# Patient Record
Sex: Female | Born: 1940 | Race: White | Hispanic: No | Smoking: Former smoker
Health system: Southern US, Community
[De-identification: ages and names within clinical notes are randomized; demographics above are authoritative.]

## PROBLEM LIST (undated history)

## (undated) DIAGNOSIS — J449 Chronic obstructive pulmonary disease, unspecified: Secondary | ICD-10-CM

## (undated) DIAGNOSIS — I10 Essential (primary) hypertension: Secondary | ICD-10-CM

## (undated) HISTORY — PX: OTHER SURGICAL HISTORY: SHX169

---

## 2016-09-12 HISTORY — PX: HIP SURGERY: SHX245

## 2017-10-20 ENCOUNTER — Other Ambulatory Visit: Payer: Self-pay | Admitting: Sports Medicine

## 2017-10-20 DIAGNOSIS — M545 Low back pain: Secondary | ICD-10-CM

## 2017-10-25 ENCOUNTER — Ambulatory Visit
Admission: RE | Admit: 2017-10-25 | Discharge: 2017-10-25 | Disposition: A | Discharge status: Home / Self Care | Payer: Medicare Other | Source: Ambulatory Visit | Attending: Sports Medicine | Admitting: Sports Medicine

## 2017-10-25 DIAGNOSIS — M545 Low back pain: Secondary | ICD-10-CM

## 2018-04-20 ENCOUNTER — Ambulatory Visit (INDEPENDENT_AMBULATORY_CARE_PROVIDER_SITE_OTHER): Payer: Medicare Other | Admitting: Internal Medicine

## 2018-04-20 ENCOUNTER — Encounter: Payer: Self-pay | Admitting: Internal Medicine

## 2018-04-20 ENCOUNTER — Ambulatory Visit (INDEPENDENT_AMBULATORY_CARE_PROVIDER_SITE_OTHER)
Admission: RE | Admit: 2018-04-20 | Discharge: 2018-04-20 | Disposition: A | Discharge status: Home / Self Care | Payer: Medicare Other | Source: Ambulatory Visit | Attending: Internal Medicine | Admitting: Internal Medicine

## 2018-04-20 VITALS — BP 120/64 | HR 84 | Ht 62.0 in | Wt 152.0 lb

## 2018-04-20 DIAGNOSIS — J449 Chronic obstructive pulmonary disease, unspecified: Secondary | ICD-10-CM | POA: Diagnosis not present

## 2018-04-20 DIAGNOSIS — J9611 Chronic respiratory failure with hypoxia: Secondary | ICD-10-CM

## 2018-04-20 MED ORDER — PREDNISONE 10 MG PO TABS
ORAL_TABLET | ORAL | 0 refills | Status: DC
Start: 2018-04-20 — End: 2018-06-04

## 2018-04-20 MED ORDER — TIOTROPIUM BROMIDE MONOHYDRATE 2.5 MCG/ACT IN AERS: 2.0000 | INHALATION_SPRAY | Freq: Every day | RESPIRATORY_TRACT | 11 refills | Status: DC

## 2018-04-20 MED ORDER — TIOTROPIUM BROMIDE MONOHYDRATE 2.5 MCG/ACT IN AERS: 2.0000 | INHALATION_SPRAY | Freq: Every day | RESPIRATORY_TRACT | 0 refills | Status: DC

## 2018-04-20 NOTE — Patient Instructions (Addendum)
Work on inhaler technique:  relax and gently blow all the way out then take a nice smooth deep breath back in, triggering the inhaler at same time you start breathing in.  Hold for up to 5 seconds if you can. Blow out thru nose. Rinse and gargle with water when done   Plan A = Automatic = symbicort 160 Take 2 puffs first thing in am and then another 2 puffs about 12 hours later and spiriva 2 puffs just in the am   Plan B = Backup Only use your albuterol as a rescue medication to be used if you can't catch your breath by resting or doing a relaxed purse lip breathing pattern.  - The less you use it, the better it will work when you need it. - Ok to use the inhaler up to 2 puffs  every 4 hours if you must but call for appointment if use goes up over your usual need - Don't leave home without it !!  (think of it like the spare tire for your car)   Plan C = Crisis - only use your albuterol nebulizer if you first try Plan B and it fails to help > ok to use the nebulizer up to every 4 hours but if start needing it regularly call for immediate appointment   Prednisone 10 mg take  4 each am x 2 days,   2 each am x 2 days,  1 each am x 2 days and stop    Pantoprazole (protonix) 40 mg   Take  30-60 min before first meal of the day     GERD (REFLUX)  is an extremely common cause of respiratory symptoms just like yours , many times with no obvious heartburn at all.    It can be treated with medication, but also with lifestyle changes including elevation of the head of your bed (ideally with 6 inch  bed blocks),  Smoking cessation, avoidance of late meals, excessive alcohol, and avoid fatty foods, chocolate, peppermint, colas, red wine, and acidic juices such as orange juice.  NO MINT OR MENTHOL PRODUCTS SO NO COUGH DROPS   USE SUGARLESS CANDY INSTEAD (Jolley ranchers or Stover's or Life Savers) or even ice chips will also do - the key is to swallow to prevent all throat clearing. NO OIL BASED VITAMINS  - use powdered substitutes.     Please remember to go to the  x-ray department downstairs in the basement  for your tests - we will call you with the results when they are available.   Please schedule a follow up office visit in 6 weeks, call sooner if needed with pfts on return  - needs alpha one ? Allergy profile next ov and bmet for hc03

## 2018-04-20 NOTE — Assessment & Plan Note (Addendum)
Quit smoking 2018  - 04/20/2018  After extensive coaching inhaler device  effectiveness =    50% smi > try spiriva respimat instead of dpi   DDX of  difficult airways management almost all start with A and  include Adherence, Ace Inhibitors, Acid Reflux, Active Sinus Disease, Alpha 1 Antitripsin deficiency, Anxiety masquerading as Airways dz,  ABPA,  Allergy(esp in young), Aspiration (esp in elderly), Adverse effects of meds,  Active smokers, A bunch of PE's (a small clot burden can't cause this syndrome unless there is already severe underlying pulm or vascular dz with poor reserve) plus two Bs  = Bronchiectasis and Beta blocker use..and one C= CHF   Adherence is always the initial "prime suspect" and is a multilayered concern that requires a "trust but verify" approach in every patient - starting with knowing how to use medications, especially inhalers, correctly, keeping up with refills and understanding the fundamental difference between maintenance and prns vs those medications only taken for a very short course and then stopped and not refilled.  - see hfa teaching  - return with all meds in hand using a trust but verify approach to confirm accurate Medication  Reconciliation The principal here is that until we are certain that the  patients are doing what we've asked, it makes no sense to ask them to do more.    ? Acid (or non-acid) GERD > always difficult to exclude as up to 75% of pts in some series report no assoc GI/ Heartburn symptoms> rec max (24h)  acid suppression and diet restrictions/ reviewed and instructions given in writing.    ? Allergy component > Prednisone 10 mg take  4 each am x 2 days,   2 each am x 2 days,  1 each am x 2 days and stop   ? Adverse effects of dpi spriva with upper airway concerns > try smi if can master technique  ? Alpha one AT > need to check status if not done     Total time devoted to counseling  > 50 % of initial 60 min office visit:  review case  with pt/ discussion of options/alternatives/ personally creating written customized instructions  in presence of pt  then going over those specific  Instructions directly with the pt including how to use all of the meds but in particular covering each new medication in detail and the difference between the maintenance= "automatic" meds and the prns using an action plan format for the latter (If this problem/symptom => do that organization reading Left to right).  Please see AVS from this visit for a full list of these instructions which I personally wrote for this pt and  are unique to this visit.   See device teaching which extended face to face time for this visit

## 2018-04-20 NOTE — Progress Notes (Signed)
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 Pulmonary Consult    Self referral for COPD. She lives in [place] and here also and wants to est care here. She has been noticing increased wheezing and SOB for the past several days. She is using her albuterol 6-7 x per day and neb about once per wk.    Dyspnea:  MMRC3 = can't walk 100 yards even at a slow pace at a flat grade s stopping due to sob  Even on 3lpm Cough: occ with food on protonix but not ac/dry  Sleep: bed is flat with 2 pillows here/ uses bricks under Headboard in [facility] SABA use: as above/ way over using at baseline and even more so last few days    No obvious day to day or daytime variability or assoc excess/ purulent sputum or mucus plugs or hemoptysis or cp or chest tightness,    overt sinus or hb symptoms.   Sleeps as above  without nocturnal  or early am exacerbation  of respiratory  c/o's or need for noct saba. Also denies any obvious fluctuation of symptoms with weather or environmental changes or other aggravating or alleviating factors except as outlined above   No unusual exposure hx or h/o childhood pna/ asthma or knowledge of premature birth.  Current Allergies, Complete Past Medical History, Past Surgical History, Family History, and Social History were reviewed in [employer] record.  ROS  The following are not active complaints unless bolded Hoarseness, sore throat, dysphagia, dental problems, itching, sneezing,  nasal congestion or discharge of excess mucus or purulent secretions,  ear ache,   fever, chills, sweats, unintended wt loss or wt gain, classically pleuritic or exertional cp,  orthopnea pnd or arm/hand swelling  or leg swelling, presyncope, palpitations, abdominal pain, anorexia, nausea, vomiting, diarrhea  or change in bowel habits or change in bladder habits, change in stools or change in urine, dysuria, hematuria,  rash, arthralgias, visual complaints, headache, numbness, weakness or ataxia or problems with walking or coordination,  change in mood or  memory.            No past medical history on file.  Outpatient Medications Prior to Visit  Medication Sig Dispense Refill  . albuterol (PROAIR HFA) 108 (90 Base) MCG/ACT inhaler Inhale 2 puffs into the lungs every 4 (four) hours as needed for wheezing or shortness of breath.    . budesonide-formoterol (SYMBICORT) 160-4.5 MCG/ACT inhaler Inhale 2 puffs into the lungs 2 (two) times daily.    . OXYGEN 3lpm 24/7  DME-?    . pantoprazole (PROTONIX) 40 MG tablet Take 40 mg by mouth daily.    Marland Kitchen tiotropium (SPIRIVA) 18 MCG inhalation capsule Place 18 mcg into inhaler and inhale daily.    Marland Kitchen triamterene-hydrochlorothiazide (DYAZIDE) 37.5-25 MG capsule Take 1 capsule by mouth daily.     No facility-administered medications prior to visit.  Objective:     BP 120/64 (BP Location: Left Arm, Cuff Size: Normal)   Pulse 84   Ht 5\' 2"  (1.575 m)   Wt 152 lb (68.9 kg)   SpO2 90%   BMI 27.80 kg/m   SpO2: 90 % O2 Type: Continuous O2 O2 Flow Rate (L/min): 3 L/min   W/c bound elderly tremulous wf nad     HEENT: nl oropharynx. Nl external ear canals without cough reflex - mild bilateral non-specific turbinate edema  - top denture   NECK :  without JVD/Nodes/TM/ nl carotid upstrokes bilaterally   LUNGS: no acc muscle use,  Mod barrel  contour chest wall with bilateral  Distant bs s audible wheeze and  without cough on insp or exp maneuver and mod   Hyperresonant  to  percussion bilaterally      CV:  RRR  no s3 or murmur or increase in P2, and no edema   ABD:  soft and nontender with pos mid insp Hoover's  in the supine position. No bruits or organomegaly appreciated, bowel sounds nl  MS:    ext warm without deformities, calf tenderness, cyanosis or clubbing No obvious joint restrictions   SKIN: warm and dry without lesions    NEURO:  alert, approp, nl sensorium with  no motor or cerebellar deficits apparent.      CXR PA and Lateral:   04/20/2018 :    I personally reviewed images and agree with radiology impression as follows:   Chronic lung changes and emphysema without evidence of acute cardiopulmonary disease.       Assessment   COPD pfts pending  Quit smoking 2018  - 04/20/2018  After extensive coaching inhaler device  effectiveness =    50% smi > try spiriva respimat instead of dpi   DDX of  difficult airways management almost all start with A and  include Adherence, Ace Inhibitors, Acid Reflux, Active Sinus Disease, Alpha 1 Antitripsin deficiency, Anxiety masquerading as Airways dz,  ABPA,  Allergy(esp in young), Aspiration (esp in elderly), Adverse effects of meds,  Active smokers, A bunch of PE's (a small clot burden can't cause this syndrome unless there is already severe underlying pulm or vascular dz with poor reserve) plus two Bs  = Bronchiectasis and Beta blocker use..and one C= CHF   Adherence is always the initial "prime suspect" and is a multilayered concern that requires a "trust but verify" approach in every patient - starting with knowing how to use medications, especially inhalers, correctly, keeping up with refills and understanding the fundamental difference between maintenance and prns vs those medications only taken for a very short course and then stopped and not refilled.  - see hfa teaching  - return with all meds in hand using a trust but verify approach to confirm accurate Medication  Reconciliation The principal here is that until we are certain  that the  patients are doing what we've asked, it makes no sense to ask them to do more.    ? Acid (or non-acid) GERD > always difficult to exclude as up to 75% of pts in some series report no assoc GI/ Heartburn symptoms> rec max (24h)  acid suppression and diet restrictions/ reviewed and instructions given in writing.    ? Allergy component > Prednisone 10 mg take  4 each am x 2 days,   2 each am x 2 days,  1 each am x 2 days and stop   ? Adverse effects of dpi spriva  with upper airway concerns > try smi if can master technique  ? Alpha one AT > need to check status if not done     Total time devoted to counseling  > 50 % of initial 60 min office visit:  review case with pt/ discussion of options/alternatives/ personally creating written customized instructions  in presence of pt  then going over those specific  Instructions directly with the pt including how to use all of the meds but in particular covering each new medication in detail and the difference between the maintenance= "automatic" meds and the prns using an action plan format for the latter (If this problem/symptom => do that organization reading Left to right).  Please see AVS from this visit for a full list of these instructions which I personally wrote for this pt and  are unique to this visit.   See device teaching which extended face to face time for this visit               Sandrea HughsMichael Kshawn Canal, MD 04/20/2018

## 2018-04-21 ENCOUNTER — Encounter: Payer: Self-pay | Admitting: Internal Medicine

## 2018-04-21 DIAGNOSIS — J9611 Chronic respiratory failure with hypoxia: Secondary | ICD-10-CM | POA: Insufficient documentation

## 2018-04-21 NOTE — Assessment & Plan Note (Signed)
[facility] 04/20/2018   Appears Adequate control on present rx, reviewed in detail with pt > no change in rx needed  For now > check bmet last ov for HC03 level

## 2018-04-23 NOTE — Progress Notes (Signed)
LMTCB

## 2018-04-24 NOTE — Progress Notes (Signed)
Spoke with pt and notified of results per Dr. Wert. Pt verbalized understanding and denied any questions. 

## 2018-06-04 ENCOUNTER — Other Ambulatory Visit (INDEPENDENT_AMBULATORY_CARE_PROVIDER_SITE_OTHER): Payer: Medicare Other

## 2018-06-04 ENCOUNTER — Ambulatory Visit (INDEPENDENT_AMBULATORY_CARE_PROVIDER_SITE_OTHER): Payer: Medicare Other | Admitting: Internal Medicine

## 2018-06-04 ENCOUNTER — Encounter: Payer: Self-pay | Admitting: Internal Medicine

## 2018-06-04 VITALS — BP 126/82 | HR 89 | Ht 59.75 in | Wt 147.0 lb

## 2018-06-04 DIAGNOSIS — J449 Chronic obstructive pulmonary disease, unspecified: Secondary | ICD-10-CM

## 2018-06-04 DIAGNOSIS — J9611 Chronic respiratory failure with hypoxia: Secondary | ICD-10-CM | POA: Diagnosis not present

## 2018-06-04 LAB — BASIC METABOLIC PANEL
BUN: 15 mg/dL (ref 6–23)
CALCIUM: 9.4 mg/dL (ref 8.4–10.5)
CO2: 30 mEq/L (ref 19–32)
Chloride: 99 mEq/L (ref 96–112)
Creatinine, Ser: 0.41 mg/dL (ref 0.40–1.20)
GFR: 160.04 mL/min (ref >60.00)
GLUCOSE: 93 mg/dL (ref 70–99)
POTASSIUM: 3.8 meq/L (ref 3.5–5.1)
SODIUM: 139 meq/L (ref 135–145)

## 2018-06-04 LAB — PULMONARY FUNCTION TEST
DL/VA % pred: 58 %
DL/VA: 2.45 ml/min/mmHg/L
DLCO UNC: 8.02 ml/min/mmHg
DLCO unc % pred: 43 %
FEF 25-75 POST: 0.22 L/s
FEF 25-75 Pre: 0.21 L/sec
FEF2575-%CHANGE-POST: 6 %
FEF2575-%PRED-POST: 16 %
FEF2575-%PRED-PRE: 15 %
FEV1-%CHANGE-POST: 0 %
FEV1-%PRED-POST: 35 %
FEV1-%PRED-PRE: 35 %
FEV1-POST: 0.59 L
FEV1-PRE: 0.6 L
FEV1FVC-%CHANGE-POST: 2 %
FEV1FVC-%PRED-PRE: 54 %
FEV6-%Change-Post: 1 %
FEV6-%PRED-PRE: 61 %
FEV6-%Pred-Post: 62 %
FEV6-POST: 1.32 L
FEV6-Pre: 1.3 L
FEV6FVC-%Change-Post: 5 %
FEV6FVC-%Pred-Post: 99 %
FEV6FVC-%Pred-Pre: 94 %
FVC-%Change-Post: -3 %
FVC-%PRED-PRE: 64 %
FVC-%Pred-Post: 62 %
FVC-PRE: 1.46 L
FVC-Post: 1.41 L
PRE FEV1/FVC RATIO: 41 %
PRE FEV6/FVC RATIO: 89 %
Post FEV1/FVC ratio: 42 %
Post FEV6/FVC ratio: 94 %

## 2018-06-04 NOTE — Progress Notes (Signed)
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 Pulmonary Consult    Self referral for COPD. She lives in [place] and here also and wants to est care here. She has been noticing increased wheezing and SOB for the past several days. She is using her albuterol 6-7 x per day and neb about once per wk.    Dyspnea:  MMRC3 = can't walk 100 yards even at a slow pace at a flat grade s stopping due to sob  Even on 3lpm Cough: occ with food on protonix but not ac/dry  Sleep: bed is flat with 2 pillows here/ uses bricks under Headboard in [place] SABA use: as above/ way over using at baseline and even more so last few days rec rec Work on inhaler technique:  relax and gently blow all the way out then take a nice smooth deep breath back in, triggering the inhaler at same time you start breathing in.  Hold for up to 5 seconds if you can. Blow out thru nose. Rinse and gargle with water when done Plan A = Automatic = symbicort 160 Take 2 puffs first thing in am and then another 2 puffs about 12 hours later and spiriva 2 puffs just in the am  Plan B = Backup Only use your albuterol as a rescue medication  Plan C = Crisis - only use your albuterol nebulizer if you first try Plan B Prednisone 10 mg take  4 each am x 2 days,   2 each am x 2 days,  1 each am x 2 days and stop  Pantoprazole (protonix) 40 mg   Take  30-60 min before first meal of the day   GERD diet  Please remember to go to the  x-ray department downstairs in the basement  for your tests - we will call you with the results when they are  available. Please schedule a follow up office visit in 6 weeks, call sooner if needed with pfts on return  - needs alpha one ? Allergy profile next ov and bmet for hc03     06/04/2018  f/u ov/Wert re: copd III / 02 dep  Chief Complaint  Patient presents with  . Follow-up    PFT's done today.  Breathing is unchanged since her last visit. She is using her albuterol inhaler 6-8 x per day.   Dyspnea:  Room to room , no better p pred and not able to use hfa well at all yet Cough: min mucoid p uses neb  Sleeping: 2 pillows / flat  SABA use: too much as above "because I thought I had to" 02: 3lpm 24/7   No obvious day to day or daytime variability or assoc excess/ purulent sputum or mucus plugs or hemoptysis or cp or chest tightness, subjective wheeze or overt sinus or hb symptoms.   Sleeping as above  without nocturnal  or early am exacerbation  of respiratory  c/o's or need  for noct saba. Also denies any obvious fluctuation of symptoms with weather or environmental changes or other aggravating or alleviating factors except as outlined above   No unusual exposure hx or h/o childhood pna/ asthma or knowledge of premature birth.  Current Allergies, Complete Past Medical History, Past Surgical History, Family History, and Social History were reviewed in [facility] electronic [facility] record.  ROS  The following are not active complaints unless bolded Hoarseness, sore throat, dysphagia, dental problems, itching, sneezing,  nasal congestion or discharge of excess mucus or purulent secretions, ear ache,   fever, chills, sweats, unintended wt loss or wt gain, classically pleuritic or exertional cp,  orthopnea pnd or arm/hand swelling  or  L leg swelling resolves with leg up , presyncope, palpitations, abdominal pain, anorexia, nausea, vomiting, diarrhea  or change in bowel habits or change in bladder habits, change in stools or change in urine, dysuria, hematuria,  rash, arthralgias, visual  complaints, headache, numbness, weakness or ataxia or problems with walking or coordination,  change in mood or  memory.        Current Meds  Medication Sig  . albuterol (PROAIR HFA) 108 (90 Base) MCG/ACT inhaler Inhale 2 puffs into the lungs every 4 (four) hours as needed for wheezing or shortness of breath.  . budesonide-formoterol (SYMBICORT) 160-4.5 MCG/ACT inhaler Inhale 2 puffs into the lungs 2 (two) times daily.  . OXYGEN 3lpm 24/7  DME-?  . pantoprazole (PROTONIX) 40 MG tablet Take 40 mg by mouth daily.  . Tiotropium Bromide Monohydrate (SPIRIVA RESPIMAT) 2.5 MCG/ACT AERS Inhale 2 puffs into the lungs daily.  Marland Kitchen. triamterene-hydrochlorothiazide (DYAZIDE) 37.5-25 MG capsule Take 1 capsule by mouth daily.                        Objective:      amb wf walking with 2 wheeled walker   Wt Readings from Last 3 Encounters:  06/04/18 147 lb (66.7 kg)  04/20/18 152 lb (68.9 kg)     Vital signs reviewed - Note on arrival 02 sats  90% on 3lpm pulsed     HEENT: nl dentition / oropharynx. Nl external ear canals without cough reflex -  Mild bilateral non-specific turbinate edema     NECK :  without JVD/Nodes/TM/ nl carotid upstrokes bilaterally   LUNGS: no acc muscle use,  Mod barrel  contour chest wall with bilateral  Distant bs s audible wheeze and  without cough on insp or exp maneuver and mod  Hyperresonant  to  percussion bilaterally     CV:  RRR  no s3 or murmur or increase in P2, and 1+ edema/ dep rubror L LE  ABD:  soft and nontender with pos mid  insp Hoover's  in the supine position. No bruits or organomegaly appreciated, bowel sounds nl  MS:   Nl gait/  ext warm without deformities, calf tenderness, cyanosis or clubbing No obvious joint restrictions   SKIN: warm and dry without lesions    NEURO:  alert, approp, nl sensorium with  no motor or cerebellar deficits apparent.          Labs ordered 06/04/2018  Alpha one AT    Labs ordered/ reviewed:       Chemistry      Component Value Date/Time   NA 139 06/04/2018 1409   K 3.8 06/04/2018 1409   CL 99 06/04/2018 1409   CO2 30 06/04/2018 1409   BUN 15 06/04/2018 1409  CREATININE 0.41 06/04/2018 1409      Component Value Date/Time   CALCIUM 9.4 06/04/2018 1409                 Assessment

## 2018-06-04 NOTE — Patient Instructions (Addendum)
Work on inhaler technique:  relax and gently blow all the way out then take a nice smooth deep breath back in, triggering the inhaler at same time you start breathing in.  Hold for up to 5 seconds if you can. Blow out symbicort out thru nose. Rinse and gargle with water when done.   Plan A = Automatic =  Symbicort 160 Take 2 puffs first thing in am and then another 2 puffs about 12 hours later and spiriva 2 puffs just in the am    Plan B = Backup Only use your albuterol= Proaire as a rescue medication to be used if you can't catch your breath by resting or doing a relaxed purse lip breathing pattern.  - The less you use it, the better it will work when you need it. - Ok to use the inhaler up to 2 puffs  every 4 hours if you must but call for appointment if use goes up over your usual need - Don't leave home without it !!  (think of it like the spare tire for your car)   Plan C = Crisis - only use your albuterol nebulizer if you first try Plan B and it fails to help > ok to use the nebulizer up to every 4 hours but if start needing it regularly call for immediate appointment   Please remember to go to the lab department downstairs in the basement  for your tests - we will call you with the results when they are available.      Please schedule a follow up office visit in 4 weeks, sooner if needed  with all medications /inhalers/ solutions in hand so we can verify exactly what you are taking. This includes all medications from all doctors and over the counters

## 2018-06-04 NOTE — Assessment & Plan Note (Addendum)
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   Group D in terms of symptom/risk and laba/lama/ICS  therefore appropriate rx at this point - though with lack of response to prednisone may also qualify as Group B so could try lama/laba neb on return if not improving with hfa/smi technique using the strategies I taught her today.  F/u alpha one AT screen when available as still candidate for replacement if pos fo MM

## 2018-06-04 NOTE — Progress Notes (Signed)
Spirometry pre and post and Dlco. 

## 2018-06-05 ENCOUNTER — Encounter: Payer: Self-pay | Admitting: Internal Medicine

## 2018-06-05 NOTE — Assessment & Plan Note (Signed)
[facility]lpm [facility] pulmonary [facility] 04/20/2018  - HC03  06/04/2018  = 30   Adequate control on present rx, reviewed in detail with pt > no change in rx needed     I had an extended discussion with the patient reviewing all relevant studies completed to date and  lasting 15 to 20 minutes of a 25 minute visit    See device teaching which extended face to face time for this visit.  Each maintenance medication was reviewed in detail including emphasizing most importantly the difference between maintenance and prns and under what circumstances the prns are to be triggered using an action plan format that is not reflected in the computer generated alphabetically organized AVS which I have not found useful in most complex patients, especially with respiratory illnesses  Please see AVS for specific instructions unique to this visit that I personally wrote and verbalized to the the pt in detail and then reviewed with pt  by my nurse highlighting any  changes in therapy recommended at today's visit to their plan of care.

## 2018-06-07 LAB — ALPHA-1 ANTITRYPSIN PHENOTYPE: A-1 Antitrypsin, Ser: 174 mg/dL (ref 83–199)

## 2018-06-08 NOTE — Progress Notes (Signed)
LMTCB

## 2018-06-11 NOTE — Progress Notes (Signed)
LMOM with results ok per DPR

## 2018-07-19 ENCOUNTER — Ambulatory Visit (INDEPENDENT_AMBULATORY_CARE_PROVIDER_SITE_OTHER): Payer: Medicare Other | Admitting: Internal Medicine

## 2018-07-19 ENCOUNTER — Encounter: Payer: Self-pay | Admitting: Internal Medicine

## 2018-07-19 DIAGNOSIS — J449 Chronic obstructive pulmonary disease, unspecified: Secondary | ICD-10-CM

## 2018-07-19 DIAGNOSIS — J9611 Chronic respiratory failure with hypoxia: Secondary | ICD-10-CM | POA: Diagnosis not present

## 2018-07-19 MED ORDER — PREDNISONE 10 MG PO TABS: ORAL_TABLET | ORAL | 0 refills | Status: AC

## 2018-07-19 MED ORDER — BUDESONIDE 0.25 MG/2ML IN SUSP: 0.2500 mg | Freq: Two times a day (BID) | RESPIRATORY_TRACT | 11 refills | Status: AC

## 2018-07-19 MED ORDER — FORMOTEROL FUMARATE 20 MCG/2ML IN NEBU: 20.0000 ug | INHALATION_SOLUTION | Freq: Two times a day (BID) | RESPIRATORY_TRACT | 11 refills | Status: AC

## 2018-07-19 MED ORDER — REVEFENACIN 175 MCG/3ML IN SOLN: 1.0000 | Freq: Every day | RESPIRATORY_TRACT | 0 refills | Status: DC

## 2018-07-19 MED ORDER — REVEFENACIN 175 MCG/3ML IN SOLN: 1.0000 | Freq: Every day | RESPIRATORY_TRACT | 11 refills | Status: AC

## 2018-07-19 NOTE — Patient Instructions (Addendum)
Plan A = Automatic = Performist/budesonide/yupelri each am and and 12 hours later use the performist and budesonide by themselves  - fill per [facility] and should be free under your plan   Stop spriva/symbicot   Plan B = Backup Only use your albuterol as a rescue medication to be used if you can't catch your breath by resting or doing a relaxed purse lip breathing pattern.  - The less you use it, the better it will work when you need it. - Ok to use the inhaler up to 2 puffs  every 4 hours if you must but call for appointment if use goes up over your usual need - Don't leave home without it !!  (think of it like the spare tire for your car)   Plan C = Crisis - only use your albuterol nebulizer if you first try Plan B and it fails to help > ok to use the nebulizer up to every 4 hours but if start needing it regularly call for immediate appointment   Plan D = Deltasone if ABC not helping -  Prednisone 10 mg take  4 each am x 2 days,   2 each am x 2 days,  1 each am x 2 days and stop    Plan E = ER - go to ER or call 911 if all else fails     Please schedule a follow up office visit in 6 weeks, call sooner if needed

## 2018-07-19 NOTE — Progress Notes (Signed)
Sheri Alvarez, female    DOB: [birth year]      MRN: [id number]    Brief patient profile:  77 yowf  Sheri Alvarez quit smoking 09/2016  Previous head nurse for [employer] lives at [facility] 50/50 with best days doe = MMRC3 = can't walk 100 yards even at a slow pace at a flat grade s stopping due to sob  / uses HC parking on 3lpm 24/7 on spriva/symb and saba     History of Present Illness  04/20/2018  1st pulmonary eval  Chief Complaint  Patient presents with  . Pulmonary Consult    Self referral for COPD. She lives in [place] and here also and wants to est care here. She has been noticing increased wheezing and SOB for the past several days. She is using her albuterol 6-7 x per day and neb about once per wk.    Dyspnea:  MMRC3 = can't walk 100 yards even at a slow pace at a flat grade s stopping due to sob  Even on 3lpm Cough: occ with food on protonix but not ac/dry  Sleep: bed is flat with 2 pillows here/ uses bricks under Headboard in [facility] SABA use: as above/ way over using at baseline and even more so last few days rec rec Work on inhaler technique:  relax and gently blow all the way out then take a nice smooth deep breath back in, triggering the inhaler at same time you start breathing in.  Hold for up to 5 seconds if you can. Blow out thru nose. Rinse and gargle with water when done Plan A = Automatic = symbicort 160 Take 2 puffs first thing in am and then another 2 puffs about 12 hours later and spiriva 2 puffs just in the am  Plan B = Backup Only use your albuterol as a rescue medication  Plan C = Crisis - only use your albuterol nebulizer if you first try Plan B Prednisone 10 mg take  4 each am x 2 days,   2 each am x 2 days,  1 each am x 2 days and stop  Pantoprazole (protonix) 40 mg   Take  30-60 min before first meal of the day   GERD diet  Please remember to go to the  x-ray department downstairs in the basement  for your tests - we will call you with the results when they are  available. Please schedule a follow up office visit in 6 weeks, call sooner if needed with pfts on return  - needs alpha one ? 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 Follow-up    PFT's done today.  Breathing is unchanged since her last visit. She is using her albuterol inhaler 6-8 x per day.   Dyspnea:  Room to room , no better p pred and not able to use hfa well at all yet Cough: min mucoid p uses neb  Sleeping: 2 pillows / flat  SABA use: too much as above "because I thought I had to" 02: 3lpm 24/7   rec Plan A = Automatic =  Symbicort 160 Take 2 puffs first thing in am and then another 2 puffs about 12 hours later and spiriva 2 puffs just in the am  Plan B = Backup Only use your albuterol= Proaire as a rescue medication  Plan C = Crisis - only use your albuterol nebulizer if you first try Plan B and it fails to help > ok to use the nebulizer up to every 4 hours but if start needing it regularly call for immediate appointment Please schedule a follow up office visit in 4 weeks, sooner if needed  with all medications /inhalers/ solutions in hand so we can verify exactly what you are taking. 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 Follow-up    Breathing has been worse just since this morning. She has been using her albuterol inhaler at least 2 x per day. She was recently started on carvedilol after having abnornal heart rythym detected prior to colonoscopy.    Dyspnea:  Room to room Cough: not much Sleeping: flat bed 2 pillows  SABA use: just using the  hfa version  and very poorly  02: 02 3lpm     No obvious day to day or daytime variability or assoc excess/ purulent sputum or mucus plugs or hemoptysis or cp or chest tightness, subjective wheeze or overt sinus or hb  symptoms.   Sleeping as above  without nocturnal  or early am exacerbation  of respiratory  c/o's or need for noct saba. Also denies any obvious fluctuation of symptoms with weather or environmental changes or other aggravating or alleviating factors except as outlined above   No unusual exposure hx or h/o childhood pna/ asthma or knowledge of premature birth.  Current Allergies, Complete Past Medical History, Past Surgical History, Family History, and Social History were reviewed in [employer] record.  ROS  The following are not active complaints unless bolded Hoarseness, sore throat, dysphagia, dental problems, itching, sneezing,  nasal congestion or discharge of excess mucus or purulent secretions, ear ache,   fever, chills, sweats, unintended wt loss or wt gain, classically pleuritic or exertional cp,  orthopnea pnd or arm/hand swelling  or leg swelling, presyncope, palpitations, abdominal pain, anorexia, nausea, vomiting, diarrhea  or change in bowel habits or change in bladder habits, change in stools or change in urine, dysuria, hematuria,  rash, arthralgias, visual complaints, headache, numbness, weakness or ataxia or problems with walking or coordination,  change in mood or  memory.        Current Meds  Medication Sig  . albuterol (PROAIR HFA) 108 (90 Base) MCG/ACT inhaler Inhale 2 puffs into the lungs every 4 (four) hours as needed for wheezing or shortness of breath.  . carvedilol (COREG) 3.125 MG tablet Take 3.125 mg by mouth 2 (two) times daily with a meal.  . OXYGEN 3lpm 24/7  DME-?  . pantoprazole (PROTONIX) 40 MG tablet Take 40 mg by mouth daily.  Marland Kitchen triamterene-hydrochlorothiazide (DYAZIDE) 37.5-25 MG capsule Take 1 capsule by mouth daily.  . [  budesonide-formoterol (SYMBICORT) 160-4.5 MCG/ACT inhaler Inhale 2 puffs into the lungs 2 (two) times daily.  .   Tiotropium Bromide Monohydrate (SPIRIVA RESPIMAT) 2.5 MCG/ACT AERS Inhale 2 puffs into the lungs  daily.                              Objective:     amb wf with rolling walker    07/19/2018       144   06/04/18 147 lb (66.7 kg)  04/20/18 152 lb (68.9  kg)     Vital signs reviewed - Note on arrival 02 sats  91% on 3lpm pulsed         top teeh oropharynx.     trace edema/ dep rubror L LE    HEENT: nl   oropharynx. Nl external ear canals without cough reflex -  Mild bilateral non-specific turbinate edema     NECK :  without JVD/Nodes/TM/ nl carotid upstrokes bilaterally   LUNGS: no acc muscle use,  Mod barrel  contour chest wall with bilateral  Distant bs s audible wheeze and  without cough on insp or exp maneuver and mod  Hyperresonant  to  percussion bilaterally     CV:  RRR  no s3 or murmur or increase in P2, and trace bilaterally sym LE  edema   ABD:  soft and nontender with pos mid insp Hoover's  in the supine position. No bruits or organomegaly appreciated, bowel sounds nl  MS:   Nl gait/  ext warm without deformities, calf tenderness, cyanosis or clubbing L shoulder with limited mobility (torn rotator)  SKIN: warm and dry with dep rubror both LE's   NEURO:  alert, approp, nl sensorium with  no motor or cerebellar deficits apparent.                              Assessment

## 2018-07-23 ENCOUNTER — Encounter: Payer: Self-pay | Admitting: Internal Medicine

## 2018-07-23 NOTE — Assessment & Plan Note (Signed)
3lpm 24/7 per [place] [facility] at initial pulmonary eval 04/20/2018  - HC03  06/04/2018  = 30   Adequate control on present rx, reviewed in detail with pt > no change in rx needed     I had an extended discussion with the patient reviewing all relevant studies completed to date and  lasting 15 to 20 minutes of a 25 minute visit    See device teaching which extended face to face time for this visit.  Each maintenance medication was reviewed in detail including emphasizing most importantly the difference between maintenance and prns and under what circumstances the prns are to be triggered using an action plan format that is not reflected in the computer generated alphabetically organized AVS which I have not found useful in most complex patients, especially with respiratory illnesses  Please see AVS for specific instructions unique to this visit that I personally wrote and verbalized to the the pt in detail and then reviewed with pt  by my nurse highlighting any  changes in therapy recommended at today's visit to their plan of care.    

## 2018-07-23 NOTE — Assessment & Plan Note (Addendum)
Quit smoking 2018  - 04/20/2018  After extensive coaching inhaler device  effectiveness =    50% smi > try spiriva respimat instead of dpi  -  PFT's  06/04/2018  FEV1 0.60 (35 % ) ratio 41  p 0 % improvement from saba p sym/spirva prior to study with DLCO  68/766c % corrects to 80  % for alv volume  - Alpha one AT screen: 06/04/18   MM  174  - 07/19/2018  After extensive coaching inhaler device,  effectiveness =    10% so changed to neb lama/laba/ics  And added pred x 6 days as Plan D   She is now on corevidol which may be partially blocking the effectiveness of her bronchodilators and due to geriatric/orthopedic frailty not able to use inhalers any more so good candidate for triple neb rx per part  B Medicare at this point   However advised ins urance and formulary restrictions may be an ongoing challenge for the forseable future and I would be happy to pick an alternative if the pt will first  provide me a list of them but pt  will need to return here for training for any new device that is required eg dpi vs hfa vs respimat.    In meantime we can always provide samples so the patient never runs out of any needed respiratory medications.

## 2018-08-30 ENCOUNTER — Ambulatory Visit: Payer: Medicare Other | Admitting: Internal Medicine

## 2018-09-04 ENCOUNTER — Inpatient Hospital Stay (HOSPITAL_COMMUNITY): Admission: EM | Disposition: E | Payer: Self-pay | Source: Home / Self Care | Attending: Internal Medicine

## 2018-09-04 ENCOUNTER — Encounter: Payer: Self-pay | Admitting: Emergency Medicine

## 2018-09-04 ENCOUNTER — Inpatient Hospital Stay (HOSPITAL_COMMUNITY)
Admission: EM | Admit: 2018-09-04 | Discharge: 2018-09-12 | DRG: 286 | Disposition: E | Payer: Medicare Other | Attending: Pulmonary Disease | Admitting: Pulmonary Disease

## 2018-09-04 ENCOUNTER — Emergency Department (HOSPITAL_COMMUNITY): Payer: Medicare Other

## 2018-09-04 DIAGNOSIS — Z79899 Other long term (current) drug therapy: Secondary | ICD-10-CM

## 2018-09-04 DIAGNOSIS — I509 Heart failure, unspecified: Secondary | ICD-10-CM | POA: Diagnosis present

## 2018-09-04 DIAGNOSIS — J9622 Acute and chronic respiratory failure with hypercapnia: Secondary | ICD-10-CM | POA: Diagnosis present

## 2018-09-04 DIAGNOSIS — R40243 Glasgow coma scale score 3-8, unspecified time: Secondary | ICD-10-CM | POA: Diagnosis present

## 2018-09-04 DIAGNOSIS — E041 Nontoxic single thyroid nodule: Secondary | ICD-10-CM | POA: Diagnosis present

## 2018-09-04 DIAGNOSIS — J982 Interstitial emphysema: Secondary | ICD-10-CM | POA: Diagnosis present

## 2018-09-04 DIAGNOSIS — N390 Urinary tract infection, site not specified: Secondary | ICD-10-CM | POA: Diagnosis not present

## 2018-09-04 DIAGNOSIS — J9601 Acute respiratory failure with hypoxia: Secondary | ICD-10-CM | POA: Diagnosis not present

## 2018-09-04 DIAGNOSIS — G931 Anoxic brain damage, not elsewhere classified: Secondary | ICD-10-CM | POA: Diagnosis not present

## 2018-09-04 DIAGNOSIS — E010 Iodine-deficiency related diffuse (endemic) goiter: Secondary | ICD-10-CM

## 2018-09-04 DIAGNOSIS — J9312 Secondary spontaneous pneumothorax: Secondary | ICD-10-CM | POA: Diagnosis not present

## 2018-09-04 DIAGNOSIS — J939 Pneumothorax, unspecified: Secondary | ICD-10-CM

## 2018-09-04 DIAGNOSIS — E872 Acidosis: Secondary | ICD-10-CM | POA: Diagnosis not present

## 2018-09-04 DIAGNOSIS — I469 Cardiac arrest, cause unspecified: Secondary | ICD-10-CM | POA: Diagnosis present

## 2018-09-04 DIAGNOSIS — J449 Chronic obstructive pulmonary disease, unspecified: Secondary | ICD-10-CM

## 2018-09-04 DIAGNOSIS — Z961 Presence of intraocular lens: Secondary | ICD-10-CM | POA: Diagnosis present

## 2018-09-04 DIAGNOSIS — Z6825 Body mass index (BMI) 25.0-25.9, adult: Secondary | ICD-10-CM

## 2018-09-04 DIAGNOSIS — E669 Obesity, unspecified: Secondary | ICD-10-CM | POA: Diagnosis present

## 2018-09-04 DIAGNOSIS — N3000 Acute cystitis without hematuria: Secondary | ICD-10-CM | POA: Diagnosis not present

## 2018-09-04 DIAGNOSIS — Z515 Encounter for palliative care: Secondary | ICD-10-CM | POA: Diagnosis not present

## 2018-09-04 DIAGNOSIS — I110 Hypertensive heart disease with heart failure: Secondary | ICD-10-CM | POA: Diagnosis present

## 2018-09-04 DIAGNOSIS — Z4682 Encounter for fitting and adjustment of non-vascular catheter: Secondary | ICD-10-CM

## 2018-09-04 DIAGNOSIS — Z978 Presence of other specified devices: Secondary | ICD-10-CM

## 2018-09-04 DIAGNOSIS — J15211 Pneumonia due to Methicillin susceptible Staphylococcus aureus: Secondary | ICD-10-CM | POA: Diagnosis not present

## 2018-09-04 DIAGNOSIS — J9621 Acute and chronic respiratory failure with hypoxia: Secondary | ICD-10-CM | POA: Diagnosis not present

## 2018-09-04 DIAGNOSIS — E875 Hyperkalemia: Secondary | ICD-10-CM | POA: Diagnosis not present

## 2018-09-04 DIAGNOSIS — I442 Atrioventricular block, complete: Secondary | ICD-10-CM | POA: Diagnosis not present

## 2018-09-04 DIAGNOSIS — Z9981 Dependence on supplemental oxygen: Secondary | ICD-10-CM

## 2018-09-04 DIAGNOSIS — B965 Pseudomonas (aeruginosa) (mallei) (pseudomallei) as the cause of diseases classified elsewhere: Secondary | ICD-10-CM | POA: Diagnosis present

## 2018-09-04 DIAGNOSIS — Z881 Allergy status to other antibiotic agents status: Secondary | ICD-10-CM

## 2018-09-04 DIAGNOSIS — K7200 Acute and subacute hepatic failure without coma: Secondary | ICD-10-CM | POA: Diagnosis present

## 2018-09-04 DIAGNOSIS — E876 Hypokalemia: Secondary | ICD-10-CM | POA: Diagnosis not present

## 2018-09-04 DIAGNOSIS — R55 Syncope and collapse: Secondary | ICD-10-CM | POA: Diagnosis not present

## 2018-09-04 DIAGNOSIS — R4189 Other symptoms and signs involving cognitive functions and awareness: Secondary | ICD-10-CM | POA: Diagnosis not present

## 2018-09-04 DIAGNOSIS — I429 Cardiomyopathy, unspecified: Secondary | ICD-10-CM | POA: Diagnosis not present

## 2018-09-04 DIAGNOSIS — J439 Emphysema, unspecified: Secondary | ICD-10-CM | POA: Diagnosis present

## 2018-09-04 DIAGNOSIS — Z66 Do not resuscitate: Secondary | ICD-10-CM | POA: Diagnosis not present

## 2018-09-04 DIAGNOSIS — Z9882 Breast implant status: Secondary | ICD-10-CM

## 2018-09-04 DIAGNOSIS — J9690 Respiratory failure, unspecified, unspecified whether with hypoxia or hypercapnia: Secondary | ICD-10-CM

## 2018-09-04 DIAGNOSIS — J9602 Acute respiratory failure with hypercapnia: Secondary | ICD-10-CM | POA: Diagnosis not present

## 2018-09-04 DIAGNOSIS — Z87891 Personal history of nicotine dependence: Secondary | ICD-10-CM

## 2018-09-04 DIAGNOSIS — G9340 Encephalopathy, unspecified: Secondary | ICD-10-CM | POA: Diagnosis not present

## 2018-09-04 HISTORY — DX: Chronic obstructive pulmonary disease, unspecified: J44.9

## 2018-09-04 HISTORY — DX: Essential (primary) hypertension: I10

## 2018-09-04 HISTORY — PX: TEMPORARY PACEMAKER: CATH118268

## 2018-09-04 HISTORY — PX: CENTRAL LINE INSERTION: CATH118232

## 2018-09-04 HISTORY — PX: LEFT HEART CATH AND CORONARY ANGIOGRAPHY: CATH118249

## 2018-09-04 LAB — POCT I-STAT, CHEM 8
BUN: 22 mg/dL (ref 8–23)
BUN: 22 mg/dL (ref 8–23)
BUN: 23 mg/dL (ref 8–23)
BUN: 23 mg/dL (ref 8–23)
CALCIUM ION: 1.19 mmol/L (ref 1.15–1.40)
CREATININE: 0.5 mg/dL (ref 0.44–1.00)
Calcium, Ion: 1.1 mmol/L — ABNORMAL LOW (ref 1.15–1.40)
Calcium, Ion: 1.16 mmol/L (ref 1.15–1.40)
Calcium, Ion: 1.16 mmol/L (ref 1.15–1.40)
Chloride: 100 mmol/L (ref 98–111)
Chloride: 101 mmol/L (ref 98–111)
Chloride: 102 mmol/L (ref 98–111)
Chloride: 104 mmol/L (ref 98–111)
Creatinine, Ser: 0.7 mg/dL (ref 0.44–1.00)
Creatinine, Ser: 0.7 mg/dL (ref 0.44–1.00)
Creatinine, Ser: 0.6 mg/dL (ref 0.44–1.00)
Glucose, Bld: 167 mg/dL — ABNORMAL HIGH (ref 70–99)
Glucose, Bld: 192 mg/dL — ABNORMAL HIGH (ref 70–99)
Glucose, Bld: 208 mg/dL — ABNORMAL HIGH (ref 70–99)
Glucose, Bld: 352 mg/dL — ABNORMAL HIGH (ref 70–99)
HCT: 35 % — ABNORMAL LOW (ref 36.0–46.0)
HCT: 36 % (ref 36.0–46.0)
HCT: 38 % (ref 36.0–46.0)
HCT: 38 % (ref 36.0–46.0)
Hemoglobin: 11.9 g/dL — ABNORMAL LOW (ref 12.0–15.0)
Hemoglobin: 12.2 g/dL (ref 12.0–15.0)
Hemoglobin: 12.9 g/dL (ref 12.0–15.0)
Hemoglobin: 12.9 g/dL (ref 12.0–15.0)
POTASSIUM: 2.9 mmol/L — AB (ref 3.5–5.1)
Potassium: 2.9 mmol/L — ABNORMAL LOW (ref 3.5–5.1)
Potassium: 3.4 mmol/L — ABNORMAL LOW (ref 3.5–5.1)
Potassium: 3.6 mmol/L (ref 3.5–5.1)
Sodium: 142 mmol/L (ref 135–145)
Sodium: 143 mmol/L (ref 135–145)
Sodium: 143 mmol/L (ref 135–145)
Sodium: 143 mmol/L (ref 135–145)
TCO2: 29 mmol/L (ref 22–32)
TCO2: 29 mmol/L (ref 22–32)
TCO2: 30 mmol/L (ref 22–32)
TCO2: 34 mmol/L — ABNORMAL HIGH (ref 22–32)

## 2018-09-04 LAB — GLUCOSE, CAPILLARY
GLUCOSE-CAPILLARY: 241 mg/dL — AB (ref 70–99)
Glucose-Capillary: 132 mg/dL — ABNORMAL HIGH (ref 70–99)
Glucose-Capillary: 149 mg/dL — ABNORMAL HIGH (ref 70–99)
Glucose-Capillary: 158 mg/dL — ABNORMAL HIGH (ref 70–99)
Glucose-Capillary: 192 mg/dL — ABNORMAL HIGH (ref 70–99)
Glucose-Capillary: 193 mg/dL — ABNORMAL HIGH (ref 70–99)
Glucose-Capillary: 212 mg/dL — ABNORMAL HIGH (ref 70–99)
Glucose-Capillary: 57 mg/dL — ABNORMAL LOW (ref 70–99)

## 2018-09-04 LAB — CBC WITH DIFFERENTIAL/PLATELET
Abs Immature Granulocytes: 0.44 10*3/uL — ABNORMAL HIGH (ref 0.00–0.07)
Basophils Absolute: 0.1 10*3/uL (ref 0.0–0.1)
Basophils Relative: 1 %
EOS ABS: 0.5 10*3/uL (ref 0.0–0.5)
Eosinophils Relative: 3 %
HCT: 44.6 % (ref 36.0–46.0)
Hemoglobin: 12.8 g/dL (ref 12.0–15.0)
Immature Granulocytes: 3 %
Lymphocytes Relative: 36 %
Lymphs Abs: 5.1 10*3/uL — ABNORMAL HIGH (ref 0.7–4.0)
MCH: 27.5 pg (ref 26.0–34.0)
MCHC: 28.7 g/dL — ABNORMAL LOW (ref 30.0–36.0)
MCV: 95.7 fL (ref 80.0–100.0)
Monocytes Absolute: 0.8 10*3/uL (ref 0.1–1.0)
Monocytes Relative: 6 %
Neutro Abs: 7.3 10*3/uL (ref 1.7–7.7)
Neutrophils Relative %: 51 %
PLATELETS: 296 10*3/uL (ref 150–400)
RBC: 4.66 MIL/uL (ref 3.87–5.11)
RDW: 12.6 % (ref 11.5–15.5)
WBC: 14.3 10*3/uL — ABNORMAL HIGH (ref 4.0–10.5)
nRBC: 0 % (ref 0.0–0.2)

## 2018-09-04 LAB — I-STAT CHEM 8, ED
BUN: 21 mg/dL (ref 8–23)
CHLORIDE: 102 mmol/L (ref 98–111)
Calcium, Ion: 1.12 mmol/L — ABNORMAL LOW (ref 1.15–1.40)
Creatinine, Ser: 0.7 mg/dL (ref 0.44–1.00)
Glucose, Bld: 277 mg/dL — ABNORMAL HIGH (ref 70–99)
HCT: 40 % (ref 36.0–46.0)
Hemoglobin: 13.6 g/dL (ref 12.0–15.0)
Potassium: 4.2 mmol/L (ref 3.5–5.1)
Sodium: 138 mmol/L (ref 135–145)
TCO2: 26 mmol/L (ref 22–32)

## 2018-09-04 LAB — POCT I-STAT 3, ART BLOOD GAS (G3+)
Acid-Base Excess: 5 mmol/L — ABNORMAL HIGH (ref 0.0–2.0)
Acid-Base Excess: 2 mmol/L (ref 0.0–2.0)
Bicarbonate: 34.9 mmol/L — ABNORMAL HIGH (ref 20.0–28.0)
Bicarbonate: 30.5 mmol/L — ABNORMAL HIGH (ref 20.0–28.0)
O2 Saturation: 100 %
O2 Saturation: 93 %
PCO2 ART: 83.3 mmHg — AB (ref 32.0–48.0)
PH ART: 7.226 — AB (ref 7.350–7.450)
Patient temperature: 97
TCO2: 38 mmol/L — ABNORMAL HIGH (ref 22–32)
TCO2: 32 mmol/L (ref 22–32)
pCO2 arterial: 65.2 mmHg (ref 32.0–48.0)
pH, Arterial: 7.278 — ABNORMAL LOW (ref 7.350–7.450)
pO2, Arterial: 486 mmHg — ABNORMAL HIGH (ref 83.0–108.0)
pO2, Arterial: 80 mmHg — ABNORMAL LOW (ref 83.0–108.0)

## 2018-09-04 LAB — APTT: APTT: 34 s (ref 24–36)

## 2018-09-04 LAB — CBC
HCT: 38.7 % (ref 36.0–46.0)
Hemoglobin: 11.8 g/dL — ABNORMAL LOW (ref 12.0–15.0)
MCH: 27.7 pg (ref 26.0–34.0)
MCHC: 30.5 g/dL (ref 30.0–36.0)
MCV: 90.8 fL (ref 80.0–100.0)
Platelets: 266 10*3/uL (ref 150–400)
RBC: 4.26 MIL/uL (ref 3.87–5.11)
RDW: 12.6 % (ref 11.5–15.5)
WBC: 18 10*3/uL — ABNORMAL HIGH (ref 4.0–10.5)
nRBC: 0 % (ref 0.0–0.2)

## 2018-09-04 LAB — URINALYSIS, ROUTINE W REFLEX MICROSCOPIC
Bilirubin Urine: NEGATIVE
GLUCOSE, UA: NEGATIVE mg/dL
Hgb urine dipstick: NEGATIVE
Ketones, ur: 5 mg/dL — AB
LEUKOCYTES UA: NEGATIVE
Nitrite: NEGATIVE
PH: 5 (ref 5.0–8.0)
Protein, ur: 30 mg/dL — AB
Specific Gravity, Urine: 1.019 (ref 1.005–1.030)

## 2018-09-04 LAB — COMPREHENSIVE METABOLIC PANEL
ALT: 60 U/L — ABNORMAL HIGH (ref 0–44)
AST: 91 U/L — ABNORMAL HIGH (ref 15–41)
Albumin: 3.3 g/dL — ABNORMAL LOW (ref 3.5–5.0)
Alkaline Phosphatase: 67 U/L (ref 38–126)
Anion gap: 20 — ABNORMAL HIGH (ref 5–15)
BUN: 17 mg/dL (ref 8–23)
CHLORIDE: 100 mmol/L (ref 98–111)
CO2: 21 mmol/L — AB (ref 22–32)
Calcium: 9.1 mg/dL (ref 8.9–10.3)
Creatinine, Ser: 0.94 mg/dL (ref 0.44–1.00)
GFR calc Af Amer: >60 mL/min (ref >60)
GFR calc non Af Amer: 59 mL/min — ABNORMAL LOW (ref >60)
Glucose, Bld: 288 mg/dL — ABNORMAL HIGH (ref 70–99)
Potassium: 4.4 mmol/L (ref 3.5–5.1)
SODIUM: 141 mmol/L (ref 135–145)
Total Bilirubin: 0.8 mg/dL (ref 0.3–1.2)
Total Protein: 6.3 g/dL — ABNORMAL LOW (ref 6.5–8.1)

## 2018-09-04 LAB — POCT ACTIVATED CLOTTING TIME: Activated Clotting Time: 109 seconds

## 2018-09-04 LAB — TROPONIN I: Troponin I: 0.21 ng/mL (ref <0.03)

## 2018-09-04 LAB — I-STAT CG4 LACTIC ACID, ED: Lactic Acid, Venous: 10.33 mmol/L (ref 0.5–1.9)

## 2018-09-04 LAB — CREATININE, SERUM
CREATININE: 0.73 mg/dL (ref 0.44–1.00)
GFR calc Af Amer: >60 mL/min (ref >60)
GFR calc non Af Amer: >60 mL/min (ref >60)

## 2018-09-04 LAB — PROTIME-INR
INR: 1.16
Prothrombin Time: 14.7 seconds (ref 11.4–15.2)

## 2018-09-04 LAB — MRSA PCR SCREENING: MRSA by PCR: NEGATIVE

## 2018-09-04 LAB — I-STAT TROPONIN, ED: Troponin i, poc: 0.03 ng/mL (ref 0.00–0.08)

## 2018-09-04 LAB — MAGNESIUM: Magnesium: 1.7 mg/dL (ref 1.7–2.4)

## 2018-09-04 SURGERY — TEMPORARY PACEMAKER
Anesthesia: LOCAL

## 2018-09-04 MED ORDER — IOHEXOL 350 MG/ML SOLN
INTRAVENOUS | Status: DC | PRN
  Administered 2018-09-04: 50 mL via INTRA_ARTERIAL

## 2018-09-04 MED ORDER — ACETAMINOPHEN 325 MG PO TABS: 650.0000 mg | ORAL_TABLET | ORAL | Status: DC | PRN

## 2018-09-04 MED ORDER — ATROPINE SULFATE 1 MG/10ML IJ SOSY
1.0000 mg | PREFILLED_SYRINGE | Freq: Once | INTRAMUSCULAR | Status: DC
  Filled 2018-09-04: qty 10

## 2018-09-04 MED ORDER — SODIUM BICARBONATE 8.4 % IV SOLN
INTRAVENOUS | Status: DC | PRN
  Administered 2018-09-04: 50 meq

## 2018-09-04 MED ORDER — ALBUTEROL SULFATE (2.5 MG/3ML) 0.083% IN NEBU
2.5000 mg | INHALATION_SOLUTION | RESPIRATORY_TRACT | Status: DC | PRN
  Administered 2018-09-07: 2.5 mg via RESPIRATORY_TRACT
  Filled 2018-09-04: qty 3

## 2018-09-04 MED ORDER — CISATRACURIUM BOLUS VIA INFUSION: INTRAVENOUS | Status: DC | PRN

## 2018-09-04 MED ORDER — HEPARIN (PORCINE) IN NACL 1000-0.9 UT/500ML-% IV SOLN
INTRAVENOUS | Status: DC | PRN
  Administered 2018-09-04: 500 mL

## 2018-09-04 MED ORDER — POTASSIUM CHLORIDE 10 MEQ/50ML IV SOLN
10.0000 meq | INTRAVENOUS | Status: AC
  Administered 2018-09-04 (×2): 10 meq via INTRAVENOUS
  Filled 2018-09-04 (×2): qty 50

## 2018-09-04 MED ORDER — ATROPINE SULFATE 1 MG/ML IJ SOLN
INTRAMUSCULAR | Status: AC | PRN
  Administered 2018-09-04: 1 mg via INTRAVENOUS

## 2018-09-04 MED ORDER — BUDESONIDE 0.25 MG/2ML IN SUSP
0.2500 mg | Freq: Two times a day (BID) | RESPIRATORY_TRACT | Status: DC
  Administered 2018-09-04 – 2018-09-05 (×3): 0.25 mg via RESPIRATORY_TRACT
  Filled 2018-09-04 (×3): qty 2

## 2018-09-04 MED ORDER — CISATRACURIUM BOLUS VIA INFUSION
0.1000 mg/kg | Freq: Once | INTRAVENOUS | Status: AC
  Administered 2018-09-04: 6.4 mg via INTRAVENOUS
  Filled 2018-09-04: qty 7

## 2018-09-04 MED ORDER — DEXTROSE 50 % IV SOLN
INTRAVENOUS | Status: AC
  Administered 2018-09-04: 12.5 g via INTRAVENOUS
  Filled 2018-09-04: qty 50

## 2018-09-04 MED ORDER — SODIUM CHLORIDE 0.9 % IV SOLN
250.0000 mL | INTRAVENOUS | Status: DC | PRN
  Administered 2018-09-05: 250 mL via INTRAVENOUS

## 2018-09-04 MED ORDER — SODIUM CHLORIDE 0.9 % IV SOLN
2.0000 mg/h | INTRAVENOUS | Status: DC
  Administered 2018-09-04 – 2018-09-06 (×3): 2 mg/h via INTRAVENOUS
  Filled 2018-09-04 (×3): qty 10

## 2018-09-04 MED ORDER — SODIUM CHLORIDE 0.9% FLUSH: 3.0000 mL | INTRAVENOUS | Status: DC | PRN

## 2018-09-04 MED ORDER — CHLORHEXIDINE GLUCONATE 0.12% ORAL RINSE (MEDLINE KIT)
15.0000 mL | Freq: Two times a day (BID) | OROMUCOSAL | Status: DC
  Administered 2018-09-04 – 2018-09-11 (×14): 15 mL via OROMUCOSAL

## 2018-09-04 MED ORDER — MIDAZOLAM HCL 2 MG/2ML IJ SOLN
INTRAMUSCULAR | Status: AC
  Filled 2018-09-04: qty 2

## 2018-09-04 MED ORDER — LIDOCAINE HCL (PF) 1 % IJ SOLN
INTRAMUSCULAR | Status: DC | PRN
  Administered 2018-09-04: 10 mL
  Administered 2018-09-04: 3 mL
  Administered 2018-09-04: 15 mL

## 2018-09-04 MED ORDER — HEPARIN SODIUM (PORCINE) 5000 UNIT/ML IJ SOLN
5000.0000 [IU] | Freq: Three times a day (TID) | INTRAMUSCULAR | Status: DC
  Administered 2018-09-04 – 2018-09-11 (×20): 5000 [IU] via SUBCUTANEOUS
  Filled 2018-09-04 (×19): qty 1

## 2018-09-04 MED ORDER — SODIUM CHLORIDE 0.9 % IV SOLN
INTRAVENOUS | Status: AC | PRN
  Administered 2018-09-04: 10 mL/h via INTRAVENOUS

## 2018-09-04 MED ORDER — HEPARIN (PORCINE) IN NACL 1000-0.9 UT/500ML-% IV SOLN
INTRAVENOUS | Status: AC
  Filled 2018-09-04: qty 1000

## 2018-09-04 MED ORDER — LIDOCAINE HCL (PF) 1 % IJ SOLN
INTRAMUSCULAR | Status: AC
  Filled 2018-09-04: qty 30

## 2018-09-04 MED ORDER — BUDESONIDE 0.25 MG/2ML IN SUSP: 0.2500 mg | Freq: Two times a day (BID) | RESPIRATORY_TRACT | Status: DC

## 2018-09-04 MED ORDER — SODIUM BICARBONATE 8.4 % IV SOLN
INTRAVENOUS | Status: AC
  Filled 2018-09-04: qty 50

## 2018-09-04 MED ORDER — ARTIFICIAL TEARS OPHTHALMIC OINT
1.0000 "application " | TOPICAL_OINTMENT | Freq: Three times a day (TID) | OPHTHALMIC | Status: DC
  Administered 2018-09-04 – 2018-09-08 (×11): 1 via OPHTHALMIC
  Filled 2018-09-04 (×4): qty 3.5

## 2018-09-04 MED ORDER — FENTANYL CITRATE (PF) 100 MCG/2ML IJ SOLN: 50.0000 ug | Freq: Once | INTRAMUSCULAR | Status: DC

## 2018-09-04 MED ORDER — MIDAZOLAM BOLUS VIA INFUSION
1.0000 mg | INTRAVENOUS | Status: DC | PRN
  Filled 2018-09-04: qty 1

## 2018-09-04 MED ORDER — CISATRACURIUM BOLUS VIA INFUSION
0.0500 mg/kg | INTRAVENOUS | Status: DC | PRN
  Filled 2018-09-04: qty 4

## 2018-09-04 MED ORDER — DEXTROSE 50 % IV SOLN
12.5000 g | INTRAVENOUS | Status: AC
  Administered 2018-09-04: 12.5 g via INTRAVENOUS

## 2018-09-04 MED ORDER — SUCCINYLCHOLINE CHLORIDE 20 MG/ML IJ SOLN
INTRAMUSCULAR | Status: AC | PRN
  Administered 2018-09-04: 100 mg via INTRAVENOUS

## 2018-09-04 MED ORDER — MIDAZOLAM HCL 2 MG/2ML IJ SOLN: 1.0000 mg | Freq: Once | INTRAMUSCULAR | Status: DC

## 2018-09-04 MED ORDER — FENTANYL BOLUS VIA INFUSION
25.0000 ug | INTRAVENOUS | Status: DC | PRN
  Administered 2018-09-07: 25 ug via INTRAVENOUS
  Filled 2018-09-04: qty 25

## 2018-09-04 MED ORDER — NOREPINEPHRINE BITARTRATE 1 MG/ML IV SOLN
0.0000 ug/min | INTRAVENOUS | Status: DC
  Administered 2018-09-04: 6 ug/min via INTRAVENOUS
  Administered 2018-09-05 (×2): 13 ug/min via INTRAVENOUS
  Administered 2018-09-05: 11 ug/min via INTRAVENOUS
  Administered 2018-09-05: 13 ug/min via INTRAVENOUS
  Filled 2018-09-04 (×6): qty 4

## 2018-09-04 MED ORDER — ETOMIDATE 2 MG/ML IV SOLN
INTRAVENOUS | Status: AC | PRN
  Administered 2018-09-04: 20 mg via INTRAVENOUS

## 2018-09-04 MED ORDER — SODIUM CHLORIDE 0.9 % IV SOLN
INTRAVENOUS | Status: DC
  Administered 2018-09-04 – 2018-09-09 (×11): via INTRAVENOUS

## 2018-09-04 MED ORDER — ENOXAPARIN SODIUM 40 MG/0.4ML ~~LOC~~ SOLN: 40.0000 mg | SUBCUTANEOUS | Status: DC

## 2018-09-04 MED ORDER — MIDAZOLAM HCL 2 MG/2ML IJ SOLN
4.0000 mg | Freq: Once | INTRAMUSCULAR | Status: AC
  Administered 2018-09-04: 4 mg via INTRAVENOUS
  Filled 2018-09-04: qty 4

## 2018-09-04 MED ORDER — SODIUM CHLORIDE 0.9 % IV SOLN
1.0000 ug/kg/min | INTRAVENOUS | Status: DC
  Administered 2018-09-04 – 2018-09-06 (×2): 1 ug/kg/min via INTRAVENOUS
  Filled 2018-09-04: qty 20

## 2018-09-04 MED ORDER — ONDANSETRON HCL 4 MG/2ML IJ SOLN: 4.0000 mg | Freq: Four times a day (QID) | INTRAMUSCULAR | Status: DC | PRN

## 2018-09-04 MED ORDER — FENTANYL CITRATE (PF) 100 MCG/2ML IJ SOLN
INTRAMUSCULAR | Status: AC
  Filled 2018-09-04: qty 2

## 2018-09-04 MED ORDER — NOREPINEPHRINE BITARTRATE 1 MG/ML IV SOLN
0.0000 ug/min | INTRAVENOUS | Status: DC
  Administered 2018-09-04: 10 ug/min via INTRAVENOUS

## 2018-09-04 MED ORDER — SODIUM CHLORIDE 0.9 % IV BOLUS
1000.0000 mL | Freq: Once | INTRAVENOUS | Status: AC
  Administered 2018-09-04: 1000 mL via INTRAVENOUS

## 2018-09-04 MED ORDER — INSULIN ASPART 100 UNIT/ML ~~LOC~~ SOLN
2.0000 [IU] | SUBCUTANEOUS | Status: DC
  Administered 2018-09-04 – 2018-09-05 (×4): 2 [IU] via SUBCUTANEOUS
  Administered 2018-09-05: 4 [IU] via SUBCUTANEOUS
  Administered 2018-09-06 – 2018-09-08 (×7): 2 [IU] via SUBCUTANEOUS
  Administered 2018-09-09: 4 [IU] via SUBCUTANEOUS
  Administered 2018-09-09: 2 [IU] via SUBCUTANEOUS
  Administered 2018-09-09 (×2): 4 [IU] via SUBCUTANEOUS
  Administered 2018-09-09: 2 [IU] via SUBCUTANEOUS
  Administered 2018-09-09: 4 [IU] via SUBCUTANEOUS
  Administered 2018-09-10: 2 [IU] via SUBCUTANEOUS
  Administered 2018-09-10: 6 [IU] via SUBCUTANEOUS
  Administered 2018-09-10 – 2018-09-11 (×3): 2 [IU] via SUBCUTANEOUS

## 2018-09-04 MED ORDER — ASPIRIN 300 MG RE SUPP
300.0000 mg | RECTAL | Status: AC
  Administered 2018-09-04: 300 mg via RECTAL
  Filled 2018-09-04: qty 1

## 2018-09-04 MED ORDER — FENTANYL 2500MCG IN NS 250ML (10MCG/ML) PREMIX INFUSION
100.0000 ug/h | INTRAVENOUS | Status: DC
  Administered 2018-09-04: 100 ug/h via INTRAVENOUS
  Administered 2018-09-05 – 2018-09-07 (×5): 200 ug/h via INTRAVENOUS
  Administered 2018-09-10: 100 ug/h via INTRAVENOUS
  Administered 2018-09-11: 150 ug/h via INTRAVENOUS
  Filled 2018-09-04 (×8): qty 250

## 2018-09-04 MED ORDER — SODIUM CHLORIDE 0.9% FLUSH: 3.0000 mL | Freq: Two times a day (BID) | INTRAVENOUS | Status: DC

## 2018-09-04 MED ORDER — MIDAZOLAM BOLUS VIA INFUSION
INTRAVENOUS | Status: DC | PRN
  Administered 2018-09-04: 2 mg via INTRAVENOUS

## 2018-09-04 MED ORDER — IPRATROPIUM-ALBUTEROL 0.5-2.5 (3) MG/3ML IN SOLN: 3.0000 mL | Freq: Four times a day (QID) | RESPIRATORY_TRACT | Status: DC

## 2018-09-04 MED ORDER — IPRATROPIUM-ALBUTEROL 0.5-2.5 (3) MG/3ML IN SOLN
3.0000 mL | Freq: Four times a day (QID) | RESPIRATORY_TRACT | Status: DC
  Administered 2018-09-04 – 2018-09-11 (×26): 3 mL via RESPIRATORY_TRACT
  Filled 2018-09-04 (×26): qty 3

## 2018-09-04 MED ORDER — FAMOTIDINE IN NACL 20-0.9 MG/50ML-% IV SOLN
20.0000 mg | Freq: Two times a day (BID) | INTRAVENOUS | Status: DC
  Administered 2018-09-04 – 2018-09-10 (×13): 20 mg via INTRAVENOUS
  Filled 2018-09-04 (×13): qty 50

## 2018-09-04 MED ORDER — ORAL CARE MOUTH RINSE
15.0000 mL | OROMUCOSAL | Status: DC
  Administered 2018-09-04 – 2018-09-11 (×63): 15 mL via OROMUCOSAL

## 2018-09-04 MED ORDER — ETOMIDATE 2 MG/ML IV SOLN
20.0000 mg | Freq: Once | INTRAVENOUS | Status: DC
  Filled 2018-09-04: qty 10

## 2018-09-04 MED ORDER — SUCCINYLCHOLINE CHLORIDE 20 MG/ML IJ SOLN: 100.0000 mg | Freq: Once | INTRAMUSCULAR | Status: DC

## 2018-09-04 MED ORDER — FENTANYL CITRATE (PF) 100 MCG/2ML IJ SOLN
50.0000 ug | Freq: Once | INTRAMUSCULAR | Status: AC
  Administered 2018-09-04: 50 ug via INTRAVENOUS
  Filled 2018-09-04: qty 2

## 2018-09-04 SURGICAL SUPPLY — 14 items
CABLE ADAPT CONN TEMP 6FT (ADAPTER) ×3 IMPLANT
CATH INFINITI 5FR MULTPACK ANG (CATHETERS) ×3 IMPLANT
CATH S G BIP PACING (CATHETERS) ×3 IMPLANT
CATH-GARD ARROW CATH SHIELD (MISCELLANEOUS) ×6
GLIDESHEATH SLEND SS 6F .021 (SHEATH) ×3 IMPLANT
KIT ARTERIAL CATH 20G 4.45CM (CATHETERS) ×3 IMPLANT
PACK CARDIAC CATHETERIZATION (CUSTOM PROCEDURE TRAY) ×3 IMPLANT
SHEATH PINNACLE 5F 10CM (SHEATH) ×3 IMPLANT
SHEATH PINNACLE 6F 10CM (SHEATH) ×3 IMPLANT
SHEATH PROBE COVER 6X72 (BAG) ×3 IMPLANT
SHIELD CATHGARD ARROW (MISCELLANEOUS) ×4 IMPLANT
TRAY CATH 3LUMEN 20C SULFAFREE (CATHETERS) ×3 IMPLANT
WIRE EMERALD 3MM-J .035X150CM (WIRE) ×3 IMPLANT
WIRE EMERALD 3MM-J .035X260CM (WIRE) ×3 IMPLANT

## 2018-09-04 NOTE — Sedation Documentation (Signed)
Times from EMS: 1250 CPR started by bystander 1256 Fire arrives continues CPR 1359 EMS arrives, PEA, continue CPR 1306 Pt has pulses

## 2018-09-04 NOTE — ED Notes (Addendum)
Pt's dentures, white sweater and shoes labeled and sent to cath lab with pt

## 2018-09-04 NOTE — H&P (View-Only) (Signed)
Assisted with transport of pateint to cath lab no noted respiratory issues at this time.  

## 2018-09-04 NOTE — Progress Notes (Signed)
eLink Physician-Brief Progress Note Patient Name: Deatra CanterSerena L Coakley DOB: 10/02/1940 MRN: [id number]   Date of Service  09/02/2018  HPI/Events of Note    eICU Interventions  1. Kcl 10 meq q1 hr x 2 doses 2. Magnesium at 9.30 PM 3. Ok to hold ssi for sugar 130. On HT protocol.      Intervention Category Intermediate Interventions: Communication with other healthcare providers and/or family;Electrolyte abnormality - evaluation and management;Hyperglycemia - evaluation and treatment  Ranee GosselinKinila T Mohan 08/15/2018, 8:57 PM

## 2018-09-04 NOTE — ED Notes (Signed)
Pt has KING airway in place and C collar from EMS. Pt placed on zoll, being paced

## 2018-09-04 NOTE — Progress Notes (Signed)
eLink Physician-Brief Progress Note Patient Name: Sheri CanterSerena L Alvarez DOB: 07/27/1941 MRN: [id number]   Date of Service  08/24/2018  HPI/Events of Note    eICU Interventions  Cardiac arrest, COPD. ABG: Type 2 resp failure. Resp acidosis improved to 7.27.  On AC 420( 8 ml/ibw)/01/29/49%.  Continue same. Plan to wean down on Vt to goals.  Follow ABG in AM. Allow hypercapnea to avoid auto peep.         Ranee GosselinKinila T Solina Heron 08/20/2018, 9:42 PM

## 2018-09-04 NOTE — Progress Notes (Signed)
Lt. Leg shin I/O removed.  Vaseline drsg. applied

## 2018-09-04 NOTE — Progress Notes (Signed)
Assisted with transport of pateint to cath lab no noted respiratory issues at this time.

## 2018-09-04 NOTE — Interval H&P Note (Signed)
History and Physical Interval Note:  08/12/2018 3:41 PM  Sheri Alvarez  has presented today for surgery, with the diagnosis of Bradiycardia  The various methods of treatment have been discussed with the patient and family. After consideration of risks, benefits and other options for treatment, the patient has consented to  Procedure(s): TEMPORARY PACEMAKER (N/A) LEFT HEART CATH AND CORONARY ANGIOGRAPHY (N/A) as a surgical intervention .  The patient's history has been reviewed, patient examined, no change in status, stable for surgery.  I have reviewed the patient's chart and labs.  Questions were answered to the patient's satisfaction.     Daniel Bensimhon

## 2018-09-04 NOTE — Consult Note (Addendum)
Cardiology Consultation:   Patient ID: Sheri Alvarez Byrd MRN: [id number]; DOB: 08/09/1941  Admit date: 09/07/2018 Date of Consult: 09/06/2018  Primary Care Provider: Marlowe Kayshoi, H Steven, MD Primary Cardiologist: Dr. Heidi DachJami Miller and Dr. Doyce LooseSarah Joyner [facility] Primary Electrophysiologist:  None    Patient Profile:   Sheri Alvarez Sheri Alvarez is a 77 y.o. female with a hx of hypertension and COPD who is being seen today for the evaluation of PEA arrest at the request of Dr. Silverio LayYao.  History of Present Illness:   Ms. Sheri Alvarez is a 77 year old Caucasian female visiting from [place] with history of hypertension and COPD on 24/7 3 Alvarez home oxygen who presented today after suffering a PEA arrest while waiting at a hair salon.  This was a witnessed arrest, CPR was started on 12:50PM by a bystander.  Fire truck arrived at 12:56.  Initial EKG showed was PEA, no shock was advised.  She received 2 epinephrine.  CPR effort was resumed. ROSC achieved by 1:06 PM.  According to the patient's daughter, she has a history of hypertension and COPD on 3 Alvarez home O2 24/7.  She was [facility] [place] [facility] [place] [facility].  Echocardiogram was reportedly to be normal.  She also had a stress test in October 2019 that was also normal as well.  She subsequently had a heart monitor that was completed on 08/23/2018 which also did not show significant irregular rhythm.  The cardiac work-up was triggered by abnormal EKG instead of actual symptoms.  Family was not aware of any anginal symptom.  [facility] King's airway, she was placed on ventilator.  EKG showed third-degree AV block with heart rate down to the 20s.   Past Medical History:  Diagnosis Date  . COPD (chronic obstructive pulmonary disease) (HCC)    3L home O2 24/7  . Hypertension     Past Surgical History:  Procedure Laterality Date  . cataract surgery    . HIP SURGERY  2018     Home Medications:  Prior to Admission  medications   Medication Sig Start Date End Date Taking? Authorizing Provider  albuterol (PROAIR HFA) 108 (90 Base) MCG/ACT inhaler Inhale 2 puffs into the lungs every 4 (four) hours as needed for wheezing or shortness of breath.    [provider]  budesonide (PULMICORT) 0.25 MG/2ML nebulizer solution Take 2 mLs (0.25 mg total) by nebulization 2 (two) times daily. 07/19/18   Nyoka CowdenWert, Michael B, MD  carvedilol (COREG) 3.125 MG tablet Take 3.125 mg by mouth 2 (two) times daily with a meal.    [provider]  formoterol (PERFOROMIST) 20 MCG/2ML nebulizer solution Take 2 mLs (20 mcg total) by nebulization 2 (two) times daily. 07/19/18   Nyoka CowdenWert, Michael B, MD  OXYGEN 3lpm 24/7  DME-?    [provider]  pantoprazole (PROTONIX) 40 MG tablet Take 40 mg by mouth daily.    [provider]  predniSONE (DELTASONE) 10 MG tablet Take  4 each am x 2 days,   2 each am x 2 days,  1 each am x 2 days and stop 07/19/18   Nyoka CowdenWert, Michael B, MD  Revefenacin (YUPELRI) 175 MCG/3ML SOLN Inhale 1 vial into the lungs daily. 07/19/18   Nyoka CowdenWert, Michael B, MD  triamterene-hydrochlorothiazide (DYAZIDE) 37.5-25 MG capsule Take 1 capsule by mouth daily.    [provider]    Inpatient Medications: Scheduled Meds: . artificial tears  1 application Both Eyes  Q8H  . aspirin  300 mg Rectal NOW  . [MAR Hold] atropine  1 mg Intravenous Once  . budesonide  0.25 mg Nebulization BID  . cisatracurium  0.1 mg/kg Intravenous Once  . [MAR Hold] etomidate  20 mg Intravenous Once  . fentaNYL (SUBLIMAZE) injection  50 mcg Intravenous Once  . heparin  5,000 Units Subcutaneous Q8H  . insulin aspart  2-6 Units Subcutaneous Q4H  . ipratropium-albuterol  3 mL Nebulization Q6H  . midazolam  1 mg Intravenous Once  . [MAR Hold] succinylcholine  100 mg Intravenous Once   Continuous Infusions: . sodium chloride    . cisatracurium (NIMBEX) infusion    . famotidine (PEPCID) IV    . fentaNYL infusion  INTRAVENOUS    . midazolam (VERSED) infusion    . norepinephrine (LEVOPHED) Adult infusion     PRN Meds:   Allergies:    Allergies  Allergen Reactions  . Cephalexin Anaphylaxis    syncope    Social History:   Social History   Socioeconomic History  . Marital status: Widowed    Spouse name: Not on file  . Number of children: Not on file  . Years of education: Not on file  . Highest education level: Not on file  Occupational History  . Not on file  Social Needs  . Financial resource strain: Not on file  . Food insecurity:    Worry: Not on file    Inability: Not on file  . Transportation needs:    Medical: Not on file    Non-medical: Not on file  Tobacco Use  . Smoking status: Former Smoker    Packs/day: 0.50    Years: 50.00    Pack years: 25.00    Types: Cigarettes    Last attempt to quit: 09/2016    Years since quitting: 1.9  . Smokeless tobacco: Never Used  Substance and Sexual Activity  . Alcohol use: Yes    Comment: occasionally  . Drug use: Never  . Sexual activity: Not on file  Lifestyle  . Physical activity:    Days per week: Not on file    Minutes per session: Not on file  . Stress: Not on file  Relationships  . Social connections:    Talks on phone: Not on file    Gets together: Not on file    Attends religious service: Not on file    Active member of club or organization: Not on file    Attends meetings of clubs or organizations: Not on file    Relationship status: Not on file  . Intimate partner violence:    Fear of current or ex partner: Not on file    Emotionally abused: Not on file    Physically abused: Not on file    Forced sexual activity: Not on file  Other Topics Concern  . Not on file  Social History Narrative  . Not on file    Family History:   Family history was unable to be obtained from the patient as patient is currently intubated.  ROS:  Please see the history of present illness.   All other ROS reviewed and negative.      Physical Exam/Data:   Vitals:   2018/09/19 1430 09-19-18 1435 2018-09-19 1438 09-19-2018 1442  BP: 111/74     Pulse: (!) 25  (!) 25   Resp: (!) 27 (!) 26 (!) 25 19  Temp: (!) 97 F (36.1 C) (!) 97.2 F (36.2 C) (!) 97.3  F (36.3 C) (!) 97.5 F (36.4 C)  SpO2: (!) 65%  (!) 62%   Height:       No intake or output data in the 24 hours ending 04/18/2018 1504 There were no vitals filed for this visit. Body mass index is 25.61 kg/m.  General: Intubated, not responsive HEENT: normal Lymph: no adenopathy Neck: no JVD Endocrine:  Unable to assess Vascular: No carotid bruits; FA pulses 2+ bilaterally without bruits  Cardiac:  normal S1, S2; RRR; no murmur  Lungs:  clear to auscultation anteriorly, intubated Abd: soft, nontender, no hepatomegaly  Ext: Ice packs Musculoskeletal:  No deformities Skin: cool Neuro:  Unable to assess, not responding to stimuli Psych: Unable to assess  EKG:  The EKG was personally reviewed and demonstrates: Third-degree AV block. Telemetry:  Telemetry was personally reviewed and demonstrates:    Relevant CV Studies:  Per patient report, recently underwent stress test in October 2019 which was normal.  Recent echocardiogram was also normal as well.  She reportedly had a heart monitor that was read on 08/23/2018 in [place] that was also normal.  Laboratory Data:  Chemistry Recent Labs  Lab 04/18/2018 1350 04/18/2018 1401  NA 141 138  K 4.4 4.2  CL 100 102  CO2 21*  --   GLUCOSE 288* 277*  BUN 17 21  CREATININE 0.94 0.70  CALCIUM 9.1  --   GFRNONAA 59*  --   GFRAA >60  --   ANIONGAP 20*  --     Recent Labs  Lab 04/18/2018 1350  PROT 6.3*  ALBUMIN 3.3*  AST 91*  ALT 60*  ALKPHOS 67  BILITOT 0.8   Hematology Recent Labs  Lab 04/18/2018 1350 04/18/2018 1401  WBC 14.3*  --   RBC 4.66  --   HGB 12.8 13.6  HCT 44.6 40.0  MCV 95.7  --   MCH 27.5  --   MCHC 28.7*  --   RDW 12.6  --   PLT 296  --    Cardiac EnzymesNo results for  input(s): TROPONINI in the last 168 hours.  Recent Labs  Lab 04/18/2018 1400  TROPIPOC 0.03    BNPNo results for input(s): BNP, PROBNP in the last 168 hours.  DDimer No results for input(s): DDIMER in the last 168 hours.  Radiology/Studies:  Dg Chest Portable 1 View  Result Date: May 21, 2018 CLINICAL DATA:  Post CPR, history COPD, CHF, former smoker EXAM: PORTABLE CHEST 1 VIEW COMPARISON:  Portable exam 1354 hours compared 04/20/2018 FINDINGS: Tip of endotracheal tube projects 5.2 cm above carina. External pacing leads project over chest. Normal heart size, mediastinal contours, and pulmonary vascularity. Atherosclerotic calcification aorta. Emphysematous changes with mild bibasilar atelectasis. Hazy infiltrates likely edema throughout LEFT lung. No pleural effusion or pneumothorax. Bones demineralized. Age-indeterminate fracture of the lateral LEFT fourth rib. IMPRESSION: COPD changes with bibasilar atelectasis and mild diffuse LEFT lung infiltrate question edema. Electronically Signed   By: Ulyses SouthwardMark  Boles M.D.   On: May 21, 2018 14:59    Assessment and Plan:   1. PEA arrest: Unclear if triggered by respiratory event versus cardiac.  Heart rate in the 20s on arrival in while in third-degree AV block.  Family is not aware that patient is taking any beta-blocker or calcium channel blocker.  Only blood pressure medication family is aware of was HCTZ.  Pending emergent temporary pacing wire and cardiac catheterization.  -PEA arrest, total downtime roughly 16 minutes before achieving ROSC.  Received 2 epinephrine.  Currently transcutaneously  paced.  Undergoing temporary pacing wire implantation and also coronary angiogram.  -Obtain echocardiogram, if RV pressure high, will need to rule out PE.  2. Hypertension: On HCTZ at home.  3. Lactic acidosis: Lactic acid greater than 10 on arrival.  Related to #1  4. COPD on 3 Alvarez home O2 24/7: Running ventilated and intubated   For questions or updates, please  contact [facility] Please consult [email or web address] for contact info under    Signed, Azalee Course, [place]  08/28/2018 3:04 PM  Agree with above.  77 y/o woman with COPD presented with PEA arrest with > 15 mins down time. Rhythm in ER was NSR with 3rd AVB with ventricular escape in 20s with prolonged periods of asystole. Lactic acid > 10.   On exam: intubated and sedated. Non-responsive. Receiving transcutaneous pacing to chest wall. Mechanical breath sounds. Obese abdomen. No significant edema.   Etiology of her arrest unclear. ? Hypoxia vs conduction system disease/ischemia. Will take emergently to cath lab for transvenous pacer and coronary angiography.  D/w ER team and CCM.   CRITICAL CARE Performed by: Arvilla Meres  Total critical care time: 35 minutes  Critical care time was exclusive of separately billable procedures and treating other patients.  Critical care was necessary to treat or prevent imminent or life-threatening deterioration.  Critical care was time spent personally by me (independent of midlevel providers or residents) on the following activities: development of treatment plan with patient and/or surrogate as well as nursing, discussions with consultants, evaluation of patient's response to treatment, examination of patient, obtaining history from patient or surrogate, ordering and performing treatments and interventions, ordering and review of laboratory studies, ordering and review of radiographic studies, pulse oximetry and re-evaluation of patient's condition.  Arvilla Meres, MD  9:02 PM

## 2018-09-04 NOTE — ED Notes (Signed)
Cath lab ready, transporting pt

## 2018-09-04 NOTE — ED Notes (Signed)
Ice applied to bilateral groins and axilla per verbal order from Dr. Silverio LayYao

## 2018-09-04 NOTE — Progress Notes (Signed)
RT NOTES: Henreitta LeberP Hoffman NP paged for critical ABG results.

## 2018-09-04 NOTE — H&P (Signed)
NAME:  Sheri CanterSerena L Veenstra, MRN:  [id number], DOB:  03/26/1941, LOS: 0 ADMISSION DATE:  09/05/2018, CONSULTATION DATE: September 04, 2018 REFERRING MD:  Dr Silverio LayYao, CHIEF COMPLAINT: Cardiac arrest  Brief History   77 year old female with history of COPD on home oxygen with witnessed PEA arrest and 16 minutes of CPR.  ROSC to complete heart block taken to Cath Lab for transvenous pacing.  History of present illness   77 year old female with past medical history as below, which is significant for COPD on home O2 and  Hypertension.  She is followed by Dr. Elesa MassedWard the pulmonary clinic who notes that on her best days she cannot walk 100 yards without having to stop due to shortness of breath.  She broke her hip about a year ago and had a replacement and since that time is been unable to lower on her own.  Her daughter had been living with her and reading these were recently high they have come back to [place].  In the morning of 12/24 the patient had no complaints and went about her morning as usual.  She went to the nail salon with her daughter where upon entering she complained of shortness of breath and very shortly after collapsed and was pulseless.  CPR was nearly immediately started by daughter and continued once fire and EMS arrived.  Initial rhythm was PEA and ACLS was done for about 60 minutes prior to ROSC.  Rhythm was then bradycardic and found to be complete heart block.  Transcutaneous pacing was initiated.  Ice packs were applied in the emergency department and cardiology consulted there.  She was taken to the cardiac catheterization lab to have a transvenous pacer placed and potentially further diagnostic work-up.  PCCM was asked to admit.  Past Medical History  COPD, HTN  Significant Hospital Events   12/24 admit, Intubated, ICU, Transvenous pacer.  Consults:  Cardiology  Procedures:  Transvenous pacemaker 12/44 > ETT 12/24 >  Significant Diagnostic Tests:  CT head 12/24 > EEG 12/24  >  Micro Data:    Antimicrobials:     Interim history/subjective:    Objective   Blood pressure 111/74, pulse (!) 25, temperature (!) 97.5 F (36.4 C), resp. rate 19, height 5\' 3"  (1.6 m), weight 63.9 kg, SpO2 (!) 62 %.    Vent Mode: PRVC FiO2 (%):  [60 %-100 %] 100 % Set Rate:  [14 bmp] 14 bmp Vt Set:  [420 mL] 420 mL PEEP:  [5 cmH20] 5 cmH20 Plateau Pressure:  [18 cmH20] 18 cmH20  No intake or output data in the 24 hours ending 08/31/2018 1509 Filed Weights   08/13/2018 1501  Weight: 63.9 kg    Examination: General: Frail elderly female on vent HENT: [place]/AT, Pupils pinpoint, no appreciable JVD Lungs: Clear bilateral breath sounds, no wheeze Cardiovascular: Paced at 70 transcutaneous Abdomen: Soft, non-distended Extremities: No acute deformity Neuro: Comatose  Resolved Hospital Problem list     Assessment & Plan:   Cardiac arrest: Initial rhythm was PEA. 16 minutes downtime with immediate bystander CPR. GCS 3 in the immediate post code period. Etiology of unclear. Family reports SOB was rather sudden and very quickly decompensated, leading me to believe this was not a primary respiratory arrest.  - Admit to ICU - TTM 33 degrees per code cool protocol - CT head - EEG - Deep sedation to facilitate cooling (Fentanyl, Versed infusions. Nimbex low dose to prevent shivering)  Bradycardia/Complete heart block - holding home coreg - to cath lab  for transvenous pacemaker.  - telemetry monitoring - Cardiology following  Inability to protect airway in post arrest setting - Full vent support - Follow CXR, ABG - VAP bundle - SBT once rewarmed  COPD without acute exacerbation - Duonebs, budesonide nebs - No role systemic steroids  Shock liver: - Follow LFT - Ammonia  Global: Discussed GOC with family on admission. They want [place] to be aggressive in the initial post-arrest period, but understand her goals may need to be revisited if she should decline, or not show signs  of some neurologic recovery after re-warmed.   Best practice:  Diet: NPO Pain/Anxiety/Delirium protocol (if indicated): Fent, versed drips VAP protocol (if indicated): yes DVT prophylaxis: hep GI prophylaxis: pepcid Glucose control: SSI Mobility: BR Code Status: Full Family Communication: Daughter (POS) updated in ED.  Disposition: ICU, critically ill.   Labs   CBC: Recent Labs  Lab 08/23/2018 1350 08/16/2018 1401  WBC 14.3*  --   NEUTROABS 7.3  --   HGB 12.8 13.6  HCT 44.6 40.0  MCV 95.7  --   PLT 296  --     Basic Metabolic Panel: Recent Labs  Lab 09/05/2018 1350 08/12/2018 1401  NA 141 138  K 4.4 4.2  CL 100 102  CO2 21*  --   GLUCOSE 288* 277*  BUN 17 21  CREATININE 0.94 0.70  CALCIUM 9.1  --    GFR: Estimated Creatinine Clearance: 53.8 mL/min (by C-G formula based on SCr of 0.7 mg/dL). Recent Labs  Lab 08/29/2018 1350 08/30/2018 1402  WBC 14.3*  --   LATICACIDVEN  --  10.33*    Liver Function Tests: Recent Labs  Lab 08/20/2018 1350  AST 91*  ALT 60*  ALKPHOS 67  BILITOT 0.8  PROT 6.3*  ALBUMIN 3.3*   No results for input(s): LIPASE, AMYLASE in the last 168 hours. No results for input(s): AMMONIA in the last 168 hours.  ABG    Component Value Date/Time   TCO2 26 08/21/2018 1401     Coagulation Profile: No results for input(s): INR, PROTIME in the last 168 hours.  Cardiac Enzymes: No results for input(s): CKTOTAL, CKMB, CKMBINDEX, TROPONINI in the last 168 hours.  HbA1C: No results found for: HGBA1C  CBG: No results for input(s): GLUCAP in the last 168 hours.  Review of Systems:   unable  Past Medical History  She,  has a past medical history of COPD (chronic obstructive pulmonary disease) (HCC) and Hypertension.   Surgical History    Past Surgical History:  Procedure Laterality Date  . cataract surgery    . HIP SURGERY  2018     Social History   reports that she quit smoking about 1 years ago. Her smoking use included  cigarettes. She has a 25.00 pack-year smoking history. She has never used smokeless tobacco. She reports current alcohol use. She reports that she does not use drugs.   Family History   Her family history is not on file.   Allergies Allergies  Allergen Reactions  . Cephalexin Anaphylaxis    syncope     Home Medications  Prior to Admission medications   Medication Sig Start Date End Date Taking? Authorizing Provider  albuterol (PROAIR HFA) 108 (90 Base) MCG/ACT inhaler Inhale 2 puffs into the lungs every 4 (four) hours as needed for wheezing or shortness of breath.    [provider]  budesonide (PULMICORT) 0.25 MG/2ML nebulizer solution Take 2 mLs (0.25 mg total) by nebulization 2 (two) times  daily. 07/19/18   Nyoka CowdenWert, Michael B, MD  carvedilol (COREG) 3.125 MG tablet Take 3.125 mg by mouth 2 (two) times daily with a meal.    [provider]  formoterol (PERFOROMIST) 20 MCG/2ML nebulizer solution Take 2 mLs (20 mcg total) by nebulization 2 (two) times daily. 07/19/18   Nyoka CowdenWert, Michael B, MD  OXYGEN 3lpm 24/7  DME-?    [provider]  pantoprazole (PROTONIX) 40 MG tablet Take 40 mg by mouth daily.    [provider]  predniSONE (DELTASONE) 10 MG tablet Take  4 each am x 2 days,   2 each am x 2 days,  1 each am x 2 days and stop 07/19/18   Nyoka CowdenWert, Michael B, MD  Revefenacin (YUPELRI) 175 MCG/3ML SOLN Inhale 1 vial into the lungs daily. 07/19/18   Nyoka CowdenWert, Michael B, MD  triamterene-hydrochlorothiazide (DYAZIDE) 37.5-25 MG capsule Take 1 capsule by mouth daily.    [provider]     Critical care time: 45 mins     Joneen RoachPaul , AGACNP-BC [facility] Pulmonary/Critical Care Pager [phone] or [phone]  03/04/2018 3:28 PM

## 2018-09-04 NOTE — ED Notes (Signed)
Pt intubated at 1353 by Dr. Silverio LayYao.

## 2018-09-04 NOTE — ED Provider Notes (Signed)
[facility] Provider Note   CSN: [id number] Arrival date & time: 08/27/2018  1339     History   Chief Complaint Chief Complaint  Patient presents with  . Cardiac Arrest    HPI Sheri Alvarez is a 77 y.o. female hx of COPD, here with cardiac arrest. Patient has 3 L [place] at baseline for her COPD. [place]. Patient apparently had worsening shortness of breath today. She went to the nail salon and then collapsed while waiting for the appointment. It was a witnessed arrest. CPR was started right away by bystanders and then Fire department. It was PEA arrest, total CPR time was 10 minutes. Patient then brady down to 20s per EMS and external pacing initiated. Patient had colonoscopy 2 months ago and apparently had normal stress test, normal holter monitor. King airway placed by EMS.   The history is provided by the EMS personnel. The history is limited by the condition of the patient.   Level V caveat- cardiac arrest   Past Medical History:  Diagnosis Date  . COPD (chronic obstructive pulmonary disease) (HCC)    3L home O2 24/7  . Hypertension     Patient Active Problem List   Diagnosis Date Noted  . Cardiac arrest, cause unspecified (HCC) 09/01/2018  . Cardiac arrest (HCC) 08/15/2018  . Chronic respiratory failure with hypoxia (HCC) 04/21/2018  . COPD GOLD III  04/20/2018    Past Surgical History:  Procedure Laterality Date  . cataract surgery    . HIP SURGERY  2018     OB History   No obstetric history on file.      Home Medications    Prior to Admission medications   Medication Sig Start Date End Date Taking? Authorizing Provider  albuterol (PROAIR HFA) 108 (90 Base) MCG/ACT inhaler Inhale 2 puffs into the lungs every 4 (four) hours as needed for wheezing or shortness of breath.    [provider]  budesonide (PULMICORT) 0.25 MG/2ML nebulizer solution Take 2 mLs (0.25 mg total) by nebulization 2  (two) times daily. 07/19/18   Nyoka CowdenWert, Michael B, MD  carvedilol (COREG) 3.125 MG tablet Take 3.125 mg by mouth 2 (two) times daily with a meal.    [provider]  formoterol (PERFOROMIST) 20 MCG/2ML nebulizer solution Take 2 mLs (20 mcg total) by nebulization 2 (two) times daily. 07/19/18   Nyoka CowdenWert, Michael B, MD  OXYGEN 3lpm 24/7  DME-?    [provider]  pantoprazole (PROTONIX) 40 MG tablet Take 40 mg by mouth daily.    [provider]  predniSONE (DELTASONE) 10 MG tablet Take  4 each am x 2 days,   2 each am x 2 days,  1 each am x 2 days and stop 07/19/18   Nyoka CowdenWert, Michael B, MD  Revefenacin (YUPELRI) 175 MCG/3ML SOLN Inhale 1 vial into the lungs daily. 07/19/18   Nyoka CowdenWert, Michael B, MD  triamterene-hydrochlorothiazide (DYAZIDE) 37.5-25 MG capsule Take 1 capsule by mouth daily.    [provider]    Family History No family history on file.  Social History Social History   Tobacco Use  . Smoking status: Former Smoker    Packs/day: 0.50    Years: 50.00    Pack years: 25.00    Types: Cigarettes    Last attempt to quit: 09/2016    Years since quitting: 1.9  . Smokeless tobacco: Never Used  Substance Use Topics  . Alcohol  use: Yes    Comment: occasionally  . Drug use: Never     Allergies   Cephalexin   Review of Systems Review of Systems  Unable to perform ROS: Intubated  All other systems reviewed and are negative.    Physical Exam Updated Vital Signs BP 111/74   Pulse (!) 25   Temp (!) 97.5 F (36.4 C)   Resp 19   Ht 5\' 3"  (1.6 m)   Wt 63.9 kg   SpO2 (!) 62%   BMI 24.94 kg/m   Physical Exam Vitals signs and nursing note reviewed.  Constitutional:      Comments: Lethargic, agonal breathing   HENT:     Mouth/Throat:     Mouth: Mucous membranes are dry.     Comments: Dry blood in the mouth  Eyes:     Comments: 4 mm, reactive bilaterally   Neck:     Comments: C collar in place  Cardiovascular:     Comments: Externally paced    Pulmonary:     Comments: No spontaneous breaths, King airway in place  Abdominal:     General: Abdomen is flat.  Musculoskeletal: Normal range of motion.  Skin:    General: Skin is warm.     Capillary Refill: Capillary refill takes less than 2 seconds.  Neurological:     Comments: Lethargic   Psychiatric:     Comments: Unable       ED Treatments / Results  Labs (all labs ordered are listed, but only abnormal results are displayed) Labs Reviewed  CBC WITH DIFFERENTIAL/PLATELET - Abnormal; Notable for the following components:      Result Value   WBC 14.3 (*)    MCHC 28.7 (*)    Lymphs Abs 5.1 (*)    Abs Immature Granulocytes 0.44 (*)    All other components within normal limits  COMPREHENSIVE METABOLIC PANEL - Abnormal; Notable for the following components:   CO2 21 (*)    Glucose, Bld 288 (*)    Total Protein 6.3 (*)    Albumin 3.3 (*)    AST 91 (*)    ALT 60 (*)    GFR calc non Af Amer 59 (*)    Anion gap 20 (*)    All other components within normal limits  URINALYSIS, ROUTINE W REFLEX MICROSCOPIC - Abnormal; Notable for the following components:   APPearance CLOUDY (*)    Ketones, ur 5 (*)    Protein, ur 30 (*)    Bacteria, UA RARE (*)    All other components within normal limits  I-STAT CHEM 8, ED - Abnormal; Notable for the following components:   Glucose, Bld 277 (*)    Calcium, Ion 1.12 (*)    All other components within normal limits  I-STAT CG4 LACTIC ACID, ED - Abnormal; Notable for the following components:   Lactic Acid, Venous 10.33 (*)    All other components within normal limits  CULTURE, BLOOD (ROUTINE X 2)  CULTURE, BLOOD (ROUTINE X 2)  URINE CULTURE  BLOOD GAS, ARTERIAL  TROPONIN I  TROPONIN I  TROPONIN I  PROTIME-INR  PROTIME-INR  APTT  APTT  BLOOD GAS, ARTERIAL  BLOOD GAS, ARTERIAL  CBC  CREATININE, SERUM  I-STAT TROPONIN, ED    EKG EKG Interpretation  Date/Time:  Tuesday 09-29-2018 14:23:24 EST Ventricular Rate:   28 PR Interval:    QRS Duration: 156 QT Interval:  683 QTC Calculation: 466 R Axis:   -71 Text Interpretation:  third degree heart block  Nonspecific IVCD with LAD LVH with secondary repolarization abnormality Probable inferior infarct, recent Confirmed by Richardean Canal [phone]) on 2018/09/10 2:28:42 PM   Radiology Dg Chest Portable 1 View  Result Date: Sep 10, 2018 CLINICAL DATA:  Post CPR, history COPD, CHF, former smoker EXAM: PORTABLE CHEST 1 VIEW COMPARISON:  Portable exam 1354 hours compared 04/20/2018 FINDINGS: Tip of endotracheal tube projects 5.2 cm above carina. External pacing leads project over chest. Normal heart size, mediastinal contours, and pulmonary vascularity. Atherosclerotic calcification aorta. Emphysematous changes with mild bibasilar atelectasis. Hazy infiltrates likely edema throughout LEFT lung. No pleural effusion or pneumothorax. Bones demineralized. Age-indeterminate fracture of the lateral LEFT fourth rib. IMPRESSION: COPD changes with bibasilar atelectasis and mild diffuse LEFT lung infiltrate question edema. Electronically Signed   By: Ulyses Southward M.D.   On: 2018/09/10 14:59    Procedures Procedures (including critical care time)  INTUBATION Performed by: Richardean Canal  Required items: required blood products, implants, devices, and special equipment available Patient identity confirmed: provided demographic data and hospital-assigned identification number Time out: Immediately prior to procedure a "time out" was called to verify the correct patient, procedure, equipment, support staff and site/side marked as required.  Indications: hypoxia, cardiac arrest  Intubation method: Glidescope Laryngoscopy   Preoxygenation: BVM  Sedatives: 20 mg Etomidate Paralytic: 100 mg Succinylcholine  Tube Size: 7.5  cuffed  Post-procedure assessment: chest rise and ETCO2 monitor Breath sounds: equal and absent over the epigastrium Tube secured with: ETT holder Chest  x-ray interpreted by radiologist and me.  Chest x-ray findings: endotracheal tube in appropriate position  Patient tolerated the procedure well with no immediate complications.  CRITICAL CARE Performed by: Richardean Canal   Total critical care time:30 minutes  Critical care time was exclusive of separately billable procedures and treating other patients.  Critical care was necessary to treat or prevent imminent or life-threatening deterioration.  Critical care was time spent personally by me on the following activities: development of treatment plan with patient and/or surrogate as well as nursing, discussions with consultants, evaluation of patient's response to treatment, examination of patient, obtaining history from patient or surrogate, ordering and performing treatments and interventions, ordering and review of laboratory studies, ordering and review of radiographic studies, pulse oximetry and re-evaluation of patient's condition.     Medications Ordered in ED Medications  etomidate (AMIDATE) injection 20 mg ( Intravenous MAR Hold 09/10/2018 1448)  succinylcholine (ANECTINE) injection 100 mg ( Intravenous MAR Hold 09/10/18 1448)  atropine 1 MG/10ML injection 1 mg ( Intravenous MAR Hold 09-10-2018 1448)  norepinephrine (LEVOPHED) 4 mg in dextrose 5 % 250 mL (0.016 mg/mL) infusion (has no administration in time range)  aspirin suppository 300 mg (has no administration in time range)  cisatracurium (NIMBEX) bolus via infusion 6.4 mg (has no administration in time range)    And  cisatracurium (NIMBEX) 200 mg in sodium chloride 0.9 % 200 mL (1 mg/mL) infusion (has no administration in time range)    And  cisatracurium (NIMBEX) bolus via infusion 3.2 mg (has no administration in time range)  artificial tears (LACRILUBE) ophthalmic ointment 1 application (has no administration in time range)  heparin injection 5,000 Units (has no administration in time range)  0.9 %  sodium chloride  infusion (has no administration in time range)  famotidine (PEPCID) IVPB 20 mg premix (has no administration in time range)  fentaNYL (SUBLIMAZE) injection 50 mcg (has no administration in time range)  fentaNYL in NS (  6110mcg/ml) infusion-PREMIX (has no administration in time range)  fentaNYL (SUBLIMAZE) bolus via infusion 25 mcg (has no administration in time range)  midazolam (VERSED) injection 1 mg (has no administration in time range)  midazolam (VERSED) 50 mg in sodium chloride 0.9 % 50 mL (1 mg/mL) infusion (has no administration in time range)  midazolam (VERSED) bolus via infusion 1 mg (has no administration in time range)  insulin aspart (novoLOG) injection 2-6 Units (has no administration in time range)  ipratropium-albuterol (DUONEB) 0.5-2.5 (3) MG/3ML nebulizer solution 3 mL (has no administration in time range)  albuterol (PROVENTIL) (2.5 MG/3ML) 0.083% nebulizer solution 2.5 mg (has no administration in time range)  budesonide (PULMICORT) nebulizer solution 0.25 mg (has no administration in time range)  Heparin (Porcine) in NaCl 1000-0.9 UT/500ML-% SOLN (500 mLs  Given 10-12-2017 1504)  lidocaine (PF) (XYLOCAINE) 1 % injection (15 mLs Infiltration Given 10-12-2017 1505)  sodium bicarbonate injection (50 mEq Intravenous Given 10-12-2017 1515)  atropine injection (1 mg Intravenous Given 10-12-2017 1341)  etomidate (AMIDATE) injection (20 mg Intravenous Given 10-12-2017 1350)  succinylcholine (ANECTINE) injection (100 mg Intravenous Given 10-12-2017 1351)  sodium chloride 0.9 % bolus 1,000 mL (1,000 mLs Intravenous New Bag/Given 10-12-2017 1402)  midazolam (VERSED) injection 4 mg (4 mg Intravenous Given 10-12-2017 1438)  fentaNYL (SUBLIMAZE) injection 50 mcg (50 mcg Intravenous Given 10-12-2017 1438)     Initial Impression / Assessment and Plan / ED Course  I have reviewed the triage vital signs and the nursing notes.  Pertinent labs & imaging results that were available during my  care of the patient were reviewed by me and considered in my medical decision making (see chart for details).     Sheri Alvarez is a 77 y.o. female here with fall. Patient had PEA arrest, total down time around 10 minutes. Started cold saline and ice packs. I suspect heart block. Patient required external pacing constantly. I was able to capture EKG and it showed complete heart block. Will consult EP and critical care. Will intubate patient.   2:40 pm I talked to cardiology. Will go up to cath for emergent pacemaker placement. Critical care to admit. Patient intubated by me and confirmed by CXR. Lactate 10 likely from cardiac arrest.    Final Clinical Impressions(s) / ED Diagnoses   Final diagnoses:  Respiratory failure [facility](HCC)    ED Discharge Orders    None       Charlynne PanderYao, Wofford Stratton Hsienta, MD 10-12-2017 1525

## 2018-09-04 NOTE — Progress Notes (Signed)
RT NOTES: Patient received from Cath Lab. ABG due from 1400. Obtained at this time.

## 2018-09-04 NOTE — ED Triage Notes (Signed)
Pt had a witnessed arrest/fall at nail salon. Bystander started CPR approx 7 min before fire arrived. Fire arrived, pt had pulses. Pulses were lost, no shock was ever advised. EMS arrived pt in PEA, CPR 10 min. 2 epi given. CBG 241. Pulses obtained, HR 88. Pt brady down, HR 28. EMS began pacing. Palpable femoral pulses.

## 2018-09-04 NOTE — Progress Notes (Signed)
   09/02/2018 1400  Clinical Encounter Type  Visited With Patient;Family  Visit Type Initial  Referral From Nurse  Consult/Referral To Chaplain  Spiritual Encounters  Spiritual Needs Emotional;Prayer  Stress Factors  Patient Stress Factors None identified  Family Stress Factors Health changes;Major life changes   Responded to ED page from Nurse. Nurse stated family of PT was in waiting room awaiting news of PT. I met PT as PT was being treat by staff. I offered silent prayer near PT. Also, I met family in Consult A and offered spiritual care with words of encouragement, ministry of presence and prayer. Doctor offered family an update of PT status. I will be available to follow up.   Chaplain Fidel Levy  [phone]

## 2018-09-05 ENCOUNTER — Inpatient Hospital Stay (HOSPITAL_COMMUNITY): Payer: Medicare Other

## 2018-09-05 DIAGNOSIS — J9601 Acute respiratory failure with hypoxia: Secondary | ICD-10-CM

## 2018-09-05 DIAGNOSIS — G9340 Encephalopathy, unspecified: Secondary | ICD-10-CM

## 2018-09-05 DIAGNOSIS — J9602 Acute respiratory failure with hypercapnia: Secondary | ICD-10-CM

## 2018-09-05 DIAGNOSIS — R55 Syncope and collapse: Secondary | ICD-10-CM

## 2018-09-05 LAB — POCT I-STAT, CHEM 8
BUN: 29 mg/dL — ABNORMAL HIGH (ref 8–23)
BUN: 27 mg/dL — ABNORMAL HIGH (ref 8–23)
Calcium, Ion: 1.17 mmol/L (ref 1.15–1.40)
Calcium, Ion: 1.16 mmol/L (ref 1.15–1.40)
Chloride: 102 mmol/L (ref 98–111)
Chloride: 103 mmol/L (ref 98–111)
Creatinine, Ser: 0.5 mg/dL (ref 0.44–1.00)
Creatinine, Ser: 0.4 mg/dL — ABNORMAL LOW (ref 0.44–1.00)
Glucose, Bld: 147 mg/dL — ABNORMAL HIGH (ref 70–99)
Glucose, Bld: 125 mg/dL — ABNORMAL HIGH (ref 70–99)
HCT: 35 % — ABNORMAL LOW (ref 36.0–46.0)
HEMATOCRIT: 36 % (ref 36.0–46.0)
Hemoglobin: 12.2 g/dL (ref 12.0–15.0)
Hemoglobin: 11.9 g/dL — ABNORMAL LOW (ref 12.0–15.0)
Potassium: 3.6 mmol/L (ref 3.5–5.1)
Potassium: 3.3 mmol/L — ABNORMAL LOW (ref 3.5–5.1)
Sodium: 141 mmol/L (ref 135–145)
Sodium: 140 mmol/L (ref 135–145)
TCO2: 32 mmol/L (ref 22–32)
TCO2: 32 mmol/L (ref 22–32)

## 2018-09-05 LAB — GLUCOSE, CAPILLARY
Glucose-Capillary: 111 mg/dL — ABNORMAL HIGH (ref 70–99)
Glucose-Capillary: 113 mg/dL — ABNORMAL HIGH (ref 70–99)
Glucose-Capillary: 121 mg/dL — ABNORMAL HIGH (ref 70–99)
Glucose-Capillary: 125 mg/dL — ABNORMAL HIGH (ref 70–99)
Glucose-Capillary: 126 mg/dL — ABNORMAL HIGH (ref 70–99)
Glucose-Capillary: 131 mg/dL — ABNORMAL HIGH (ref 70–99)
Glucose-Capillary: 132 mg/dL — ABNORMAL HIGH (ref 70–99)
Glucose-Capillary: 140 mg/dL — ABNORMAL HIGH (ref 70–99)
Glucose-Capillary: 149 mg/dL — ABNORMAL HIGH (ref 70–99)
Glucose-Capillary: 151 mg/dL — ABNORMAL HIGH (ref 70–99)
Glucose-Capillary: 153 mg/dL — ABNORMAL HIGH (ref 70–99)

## 2018-09-05 LAB — BASIC METABOLIC PANEL
Anion gap: 8 (ref 5–15)
Anion gap: 8 (ref 5–15)
BUN: 20 mg/dL (ref 8–23)
BUN: 20 mg/dL (ref 8–23)
CHLORIDE: 106 mmol/L (ref 98–111)
CO2: 28 mmol/L (ref 22–32)
CO2: 24 mmol/L (ref 22–32)
Calcium: 7.9 mg/dL — ABNORMAL LOW (ref 8.9–10.3)
Calcium: 7.7 mg/dL — ABNORMAL LOW (ref 8.9–10.3)
Chloride: 105 mmol/L (ref 98–111)
Creatinine, Ser: 0.59 mg/dL (ref 0.44–1.00)
Creatinine, Ser: 0.53 mg/dL (ref 0.44–1.00)
GFR calc Af Amer: >60 mL/min (ref >60)
GFR calc Af Amer: >60 mL/min (ref >60)
GFR calc non Af Amer: >60 mL/min (ref >60)
GFR calc non Af Amer: >60 mL/min (ref >60)
GLUCOSE: 174 mg/dL — AB (ref 70–99)
Glucose, Bld: 154 mg/dL — ABNORMAL HIGH (ref 70–99)
Potassium: 3.9 mmol/L (ref 3.5–5.1)
Potassium: 3.1 mmol/L — ABNORMAL LOW (ref 3.5–5.1)
SODIUM: 138 mmol/L (ref 135–145)
Sodium: 141 mmol/L (ref 135–145)

## 2018-09-05 LAB — TROPONIN I
Troponin I: 0.18 ng/mL (ref <0.03)
Troponin I: 0.16 ng/mL (ref <0.03)
Troponin I: 0.13 ng/mL (ref <0.03)

## 2018-09-05 LAB — PHOSPHORUS: Phosphorus: 3.1 mg/dL (ref 2.5–4.6)

## 2018-09-05 LAB — CBC
HCT: 39.6 % (ref 36.0–46.0)
Hemoglobin: 12.5 g/dL (ref 12.0–15.0)
MCH: 27.9 pg (ref 26.0–34.0)
MCHC: 31.6 g/dL (ref 30.0–36.0)
MCV: 88.4 fL (ref 80.0–100.0)
Platelets: 270 10*3/uL (ref 150–400)
RBC: 4.48 MIL/uL (ref 3.87–5.11)
RDW: 12.6 % (ref 11.5–15.5)
WBC: 18.3 10*3/uL — ABNORMAL HIGH (ref 4.0–10.5)
nRBC: 0 % (ref 0.0–0.2)

## 2018-09-05 LAB — PROTIME-INR
INR: 1.11
Prothrombin Time: 14.3 seconds (ref 11.4–15.2)

## 2018-09-05 LAB — POCT I-STAT 3, ART BLOOD GAS (G3+)
Acid-Base Excess: 2 mmol/L (ref 0.0–2.0)
Bicarbonate: 28.7 mmol/L — ABNORMAL HIGH (ref 20.0–28.0)
O2 Saturation: 96 %
Patient temperature: 91.6
TCO2: 30 mmol/L (ref 22–32)
pCO2 arterial: 42.9 mmHg (ref 32.0–48.0)
pH, Arterial: 7.416 (ref 7.350–7.450)
pO2, Arterial: 68 mmHg — ABNORMAL LOW (ref 83.0–108.0)

## 2018-09-05 LAB — CREATININE, SERUM
Creatinine, Ser: 0.59 mg/dL (ref 0.44–1.00)
GFR calc Af Amer: >60 mL/min (ref >60)
GFR calc non Af Amer: >60 mL/min (ref >60)

## 2018-09-05 LAB — MAGNESIUM: Magnesium: 2.9 mg/dL — ABNORMAL HIGH (ref 1.7–2.4)

## 2018-09-05 LAB — URINE CULTURE: Culture: NO GROWTH

## 2018-09-05 LAB — AMMONIA: Ammonia: 16 umol/L (ref 9–35)

## 2018-09-05 LAB — APTT: aPTT: 36 seconds (ref 24–36)

## 2018-09-05 LAB — ECHOCARDIOGRAM COMPLETE
Height: 63 in
Weight: 2543.23 oz

## 2018-09-05 MED ORDER — LEVETIRACETAM IN NACL 1500 MG/100ML IV SOLN
1500.0000 mg | Freq: Once | INTRAVENOUS | Status: AC
  Administered 2018-09-05: 1500 mg via INTRAVENOUS
  Filled 2018-09-05: qty 100

## 2018-09-05 MED ORDER — POTASSIUM CHLORIDE 20 MEQ/15ML (10%) PO SOLN
20.0000 meq | Freq: Once | ORAL | Status: AC
  Administered 2018-09-05: 20 meq

## 2018-09-05 MED ORDER — SODIUM CHLORIDE 0.9% FLUSH: 10.0000 mL | INTRAVENOUS | Status: DC | PRN

## 2018-09-05 MED ORDER — CHLORHEXIDINE GLUCONATE CLOTH 2 % EX PADS
6.0000 | MEDICATED_PAD | Freq: Every day | CUTANEOUS | Status: DC
  Administered 2018-09-05: 6 via TOPICAL

## 2018-09-05 MED ORDER — POTASSIUM CHLORIDE 20 MEQ/15ML (10%) PO SOLN
20.0000 meq | Freq: Every day | ORAL | Status: DC
  Filled 2018-09-05: qty 15

## 2018-09-05 MED ORDER — SODIUM CHLORIDE 0.9% FLUSH
10.0000 mL | Freq: Two times a day (BID) | INTRAVENOUS | Status: DC
  Administered 2018-09-05 – 2018-09-10 (×9): 10 mL

## 2018-09-05 MED ORDER — POTASSIUM CHLORIDE 10 MEQ/100ML IV SOLN
10.0000 meq | INTRAVENOUS | Status: AC
  Administered 2018-09-05 (×3): 10 meq via INTRAVENOUS
  Filled 2018-09-05 (×3): qty 100

## 2018-09-05 MED ORDER — NOREPINEPHRINE 16 MG/250ML-% IV SOLN
0.0000 ug/min | INTRAVENOUS | Status: DC
  Administered 2018-09-06: 26 ug/min via INTRAVENOUS
  Administered 2018-09-06: 20 ug/min via INTRAVENOUS
  Administered 2018-09-08: 5 ug/min via INTRAVENOUS
  Filled 2018-09-05 (×4): qty 250

## 2018-09-05 MED ORDER — MAGNESIUM SULFATE 2 GM/50ML IV SOLN
2.0000 g | Freq: Once | INTRAVENOUS | Status: AC
  Administered 2018-09-05: 2 g via INTRAVENOUS
  Filled 2018-09-05: qty 50

## 2018-09-05 MED ORDER — LEVETIRACETAM IN NACL 1000 MG/100ML IV SOLN
1000.0000 mg | Freq: Two times a day (BID) | INTRAVENOUS | Status: DC
  Administered 2018-09-06 – 2018-09-11 (×10): 1000 mg via INTRAVENOUS
  Filled 2018-09-05 (×10): qty 100

## 2018-09-05 NOTE — Progress Notes (Signed)
eLink Physician-Brief Progress Note Patient Name: Sheri Alvarez DOB: 09/14/1940 MRN: [id number]   Date of Service  09/05/2018  HPI/Events of Note    eICU Interventions  Kcl 10 meq x 3 doses and Mag 2 gm IV once ordered.  Follow levels in am.      Intervention Category Minor Interventions: Electrolytes abnormality - evaluation and management  Sheri GosselinKinila T Alvarez 09/05/2018, 1:40 AM

## 2018-09-05 NOTE — Progress Notes (Signed)
Portable EEG completed, results pending. 

## 2018-09-05 NOTE — Procedures (Signed)
History: 77 yo F s/p cardiac arrest  Sedation: fentanyl and versed  Technique: This is a 21 channel routine scalp EEG performed at the bedside with bipolar and monopolar montages arranged in accordance to the international 10/20 system of electrode placement. One channel was dedicated to EKG recording.    Background: There is generalized irregular delta and theta activity throughout the recording. There are moderately frequent generalized spike-and-wave discharges. These occur in isolation, with 10 - 25 seconds between discharges. They are not periodic. There is no clear posterior dominant rhythm.    Photic stimulation: Physiologic driving is not performed  EEG Abnormalities: 1) generalized interictal epileptiform discharges 2) generalized irregular slow activity  Clinical Interpretation: This EEG is consistent with cortical irritability that is generalized in nature. This does represent an increased risk of seizure, but no seizure was recorded on this study.   Ritta SlotMcNeill Cynthia Cogle, MD [facility] [phone]  If 7pm- 7am, please page neurology on call as listed in AMION.

## 2018-09-05 NOTE — Progress Notes (Addendum)
NAME:  Sheri Alvarez, MRN:  [id number], DOB:  07-07-1941, LOS: 1 ADMISSION DATE:  2018-09-06, CONSULTATION DATE: Sep 06, 2018 REFERRING MD:  Dr Silverio Lay, CHIEF COMPLAINT: Cardiac arrest  Brief History   77 year old female with history of COPD on home oxygen with witnessed PEA arrest and 16 minutes of CPR.  ROSC to complete heart block taken to Cath Lab for transvenous pacing.  History of present illness   77 year old female with past medical history as below, which is significant for COPD on home O2 and  Hypertension.  She is followed by Dr. Elesa Massed the pulmonary clinic who notes that on her best days she cannot walk 100 yards without having to stop due to shortness of breath.  She broke her hip about a year ago and had a replacement and since that time is been unable to lower on her own.  Her daughter had been living with her and reading these were recently high they have come back to [place].  In the morning of 12/24 the patient had no complaints and went about her morning as usual.  She went to the nail salon with her daughter where upon entering she complained of shortness of breath and very shortly after collapsed and was pulseless.  CPR was nearly immediately started by daughter and continued once fire and EMS arrived.  Initial rhythm was PEA and ACLS was done for about 60 minutes prior to ROSC.  Rhythm was then bradycardic and found to be complete heart block.  Transcutaneous pacing was initiated.  Ice packs were applied in the emergency department and cardiology consulted there.  She was taken to the cardiac catheterization lab to have a transvenous pacer placed and potentially further diagnostic work-up.  PCCM was asked to admit.  Past Medical History  COPD, HTN  Significant Hospital Events   12/24 admit, Intubated, ICU, Transvenous pacer.  Consults:  Cardiology  Procedures:  Transvenous pacemaker 12/44 > ETT 12/24 >  Significant Diagnostic Tests:  CT head 12/24 > EEG 12/24  >  Micro Data:    Antimicrobials:     Interim history/subjective:  Currently being cooled. Objective   Blood pressure (!) 91/57, pulse 80, temperature (!) 91 F (32.8 C), resp. rate (!) 22, height 5\' 3"  (1.6 m), weight 72.1 kg, SpO2 100 %. CVP:  [4 mmHg-12 mmHg] 5 mmHg  Vent Mode: PRVC FiO2 (%):  [50 %-100 %] 50 % Set Rate:  [14 bmp-22 bmp] 22 bmp Vt Set:  [42 mL-420 mL] 42 mL PEEP:  [5 cmH20] 5 cmH20 Plateau Pressure:  [15 cmH20-20 cmH20] 18 cmH20   Intake/Output Summary (Last 24 hours) at 09/05/2018 0944 Last data filed at 09/05/2018 0900 Gross per 24 hour  Intake 1692.06 ml  Output 735 ml  Net 957.06 ml   Filed Weights   09-06-2018 1501 09/05/18 0500  Weight: 63.9 kg 72.1 kg    Examination: General: Elderly female currently heavily sedated and neuromuscular blockade HEENT: Tracheal tube is in place Neuro: Currently on neuromuscular blockade CV: Currently being paced PULM: even/non-labored, lungs bilaterally diminished GI: Soft nontender faint bowel sounds Extremities: warm/dry, 1+ edema  Skin: no rashes or lesions   Resolved Hospital Problem list     Assessment & Plan:   Cardiac arrest: Initial rhythm was PEA. 16 minutes downtime with immediate bystander CPR. GCS 3 in the immediate post code period. Etiology of unclear. Family reports SOB was rather sudden and very quickly decompensated, leading me to believe this was not a primary respiratory  arrest.  -Standard ICU admit for 33 degrees cooling protocol -Continue the head is pending -Deep sedation and neuromuscular blockade   Bradycardia/Complete heart block -Holding beta blockers -Post transvenous pacing wire in the Cath Lab with 25% EF severe hypokinesis and cardiomyopathy -Cardiology is following  Inability to protect airway in post arrest setting -Full mechanical ventilatory support -Trend chest x-ray and ABG -Spontaneous breathing trials once warmed  COPD without acute  exacerbation -Broncho-dilators -No need for steroids  Shock liver: -Monitor LFTs -Ammonia level 16 on 09/05/2018  Global: Discussed GOC with family on admission. They want us to be aggressive in the initial post-arrest period, but understand her goals may need to be revisited if she should decline, or not show signs of some neurologic recovery after re-warmed.   Best practice:  Diet: NPO Pain/Anxiety/Delirium protocol (if indicated): Fent, versed drips VAP protocol (if indicated): yes DVT prophylaxis: hep GI prophylaxis: pepcid Glucose control: SSI Mobility: BR Code Status: Full Family Communication: 09/05/2018 no family at bedside Disposition: ICU, critically ill.   Labs   CBC: Recent Labs  Lab 09/02/2018 1350  08/30/2018 1747 09/10/2018 1942 08/23/2018 2144 08/21/2018 2339 09/05/18 0421  WBC 14.3*  --  18.0*  --   --   --  18.3*  NEUTROABS 7.3  --   --   --   --   --   --   HGB 12.8   < > 11.8* 12.9 12.9 12.2 12.5  HCT 44.6   < > 38.7 38.0 38.0 36.0 39.6  MCV 95.7  --  90.8  --   --   --  88.4  PLT 296  --  266  --   --   --  270   < > = values in this interval not displayed.    Basic Metabolic Panel: Recent Labs  Lab 08/27/2018 1350  08/13/2018 1743  08/13/2018 1942 09/07/2018 2132 08/16/2018 2144 08/16/2018 2339 09/05/18 0047 09/05/18 0421  NA 141   < > 142  --  143  --  143 143  --  141  K 4.4   < > 3.6  --  2.9*  --  2.9* 3.4*  --  3.9  CL 100   < > 100  --  101  --  102 104  --  105  CO2 21*  --   --   --   --   --   --   --   --  28  GLUCOSE 288*   < > 352*  --  208*  --  192* 167*  --  174*  BUN 17   < > 23  --  22  --  22 23  --  20  CREATININE 0.94   < > 0.70   < > 0.70  --  0.60 0.50 0.59 0.59  CALCIUM 9.1  --   --   --   --   --   --   --   --  7.9*  MG  --   --   --   --   --  1.7  --   --   --  2.9*  PHOS  --   --   --   --   --   --   --   --   --  3.1   < > = values in this interval not displayed.   GFR: Estimated Creatinine Clearance: 57 mL/min (by C-G  formula based on SCr of  0.59 mg/dL). Recent Labs  Lab 08/31/2018 1350 08/25/2018 1402 08/16/2018 1747 09/05/18 0421  WBC 14.3*  --  18.0* 18.3*  LATICACIDVEN  --  10.33*  --   --     Liver Function Tests: Recent Labs  Lab 08/29/2018 1350  AST 91*  ALT 60*  ALKPHOS 67  BILITOT 0.8  PROT 6.3*  ALBUMIN 3.3*   No results for input(s): LIPASE, AMYLASE in the last 168 hours. Recent Labs  Lab 09/05/18 0421  AMMONIA 16    ABG    Component Value Date/Time   PHART 7.416 09/05/2018 0425   PCO2ART 42.9 09/05/2018 0425   PO2ART 68.0 (L) 09/05/2018 0425   HCO3 28.7 (H) 09/05/2018 0425   TCO2 30 09/05/2018 0425   O2SAT 96.0 09/05/2018 0425     Coagulation Profile: Recent Labs  Lab 08/20/2018 1747 09/05/18 0047  INR 1.16 1.11    Cardiac Enzymes: Recent Labs  Lab 08/31/2018 1747 09/05/18 0047 09/05/18 0421  TROPONINI 0.21* 0.18* 0.16*    HbA1C: No results found for: HGBA1C  CBG: Recent Labs  Lab 09/05/18 0056 09/05/18 0228 09/05/18 0423 09/05/18 0638 09/05/18 0828  GLUCAP 140* 125* 149* 151* 153*      Critical care time: 40 mins     Brett CanalesSteve Minor ACNP [facility] Pager [phone] till 1 pm If no answer page [phone] 09/05/2018, 9:44 AM  Attending Note:  77 year old female s/p PEA arrest who presents to PCCM for hypothermia protocol and was taken to the cath lab for pacer placement as she was completely pacer dependent.  On exam, coarse BS diffusely.  I reviewed CXR myself, ETT is in a good position.  Discussed with PCCM-NP.  Will maintain on full vent support.  Adjust vent for ABG.  Pressor support as ordered.  Head CT now to evaluate for neuro injury.  EEG pending.  Continue hypothermia protocol.  Once patient is warmed will need to assess neuro status then discuss plan of care with family.  PCCM will continue to manage.  The patient is critically ill with multiple organ systems failure and requires high complexity decision making for assessment and  support, frequent evaluation and titration of therapies, application of advanced monitoring technologies and extensive interpretation of multiple databases.   Critical Care Time devoted to patient care services described in this note is  34  Minutes. This time reflects time of care of this signee Dr Koren BoundWesam Aaliyah Gavel. This critical care time does not reflect procedure time, or teaching time or supervisory time of PA/NP/Med student/Med Resident etc but could involve care discussion time.  Alyson ReedyWesam G. Cedra Villalon, M.D. [facility]. Pager: [phone]. After hours pager: [phone].

## 2018-09-05 NOTE — Consult Note (Signed)
ELECTROPHYSIOLOGY CONSULT NOTE    Primary Care Physician: Rogelia Mire, MD Referring Physician:  Dr Haroldine Laws  Admit Date: 08/19/2018  Reason for consultation:  AV block  Sheri Alvarez is a 77 y.o. female with a h/o COPD who is admitted with cardiac arrest.  Per report (pt unable to provide history) the patient developed acute SOB and then collapsed while at the nail salon.  Reports suggest initially PEA rhythm, though upon arrival to [facility], the patient has complete heart block with very little escape.  The patient underwent cath and temp wire placement.  She is now being cooled. She has regained AV conduction.  Pt unable to provide additional history.  No family currently available.  Past Medical History:  Diagnosis Date  . COPD (chronic obstructive pulmonary disease) ([place])    3L home O2 24/7  . Hypertension    Past Surgical History:  Procedure Laterality Date  . cataract surgery    . HIP SURGERY  2018    . artificial tears  1 application Both Eyes Z3G  . atropine  1 mg Intravenous Once  . budesonide  0.25 mg Nebulization BID  . chlorhexidine gluconate (MEDLINE KIT)  15 mL Mouth Rinse BID  . Chlorhexidine Gluconate Cloth  6 each Topical Daily  . etomidate  20 mg Intravenous Once  . fentaNYL (SUBLIMAZE) injection  50 mcg Intravenous Once  . heparin  5,000 Units Subcutaneous Q8H  . insulin aspart  2-6 Units Subcutaneous Q4H  . ipratropium-albuterol  3 mL Nebulization Q6H  . mouth rinse  15 mL Mouth Rinse 10 times per day  . midazolam  1 mg Intravenous Once  . sodium chloride flush  10-40 mL Intracatheter Q12H  . succinylcholine  100 mg Intravenous Once   . sodium chloride 50 mL/hr at 09/05/18 0900  . sodium chloride Stopped (09/05/18 0543)  . cisatracurium (NIMBEX) infusion 1 mcg/kg/min (09/05/18 0900)  . famotidine (PEPCID) IV Stopped (08/19/2018 2113)  . fentaNYL infusion INTRAVENOUS 200 mcg/hr (09/05/18 0900)  . midazolam (VERSED) infusion 2 mg/hr (09/05/18  0900)  . norepinephrine (LEVOPHED) Adult infusion 13 mcg/min (09/05/18 0945)    Allergies  Allergen Reactions  . Cephalexin Anaphylaxis    syncope    Social History   Socioeconomic History  . Marital status: Widowed    Spouse name: Not on file  . Number of children: Not on file  . Years of education: Not on file  . Highest education level: Not on file  Occupational History  . Not on file  Social Needs  . Financial resource strain: Not on file  . Food insecurity:    Worry: Not on file    Inability: Not on file  . Transportation needs:    Medical: Not on file    Non-medical: Not on file  Tobacco Use  . Smoking status: Former Smoker    Packs/day: 0.50    Years: 50.00    Pack years: 25.00    Types: Cigarettes    Last attempt to quit: 09/2016    Years since quitting: 1.9  . Smokeless tobacco: Never Used  Substance and Sexual Activity  . Alcohol use: Yes    Comment: occasionally  . Drug use: Never  . Sexual activity: Not on file  Lifestyle  . Physical activity:    Days per week: Not on file    Minutes per session: Not on file  . Stress: Not on file  Relationships  . Social connections:    Talks on  phone: Not on file    Gets together: Not on file    Attends religious service: Not on file    Active member of club or organization: Not on file    Attends meetings of clubs or organizations: Not on file    Relationship status: Not on file  . Intimate partner violence:    Fear of current or ex partner: Not on file    Emotionally abused: Not on file    Physically abused: Not on file    Forced sexual activity: Not on file  Other Topics Concern  . Not on file  Social History Narrative  . Not on file    FH- unable to assess  ROS- unable to assess  Physical Exam: Telemetry:  Sinus with 1:1 AV conduction now (previously V paced) Vitals:   09/05/18 0810 09/05/18 0900 09/05/18 0915 09/05/18 0930  BP:  (!) 91/57    Pulse:      Resp:  (!) 22 (!) 22 (!) 22  Temp:   (!) 91 F (32.8 C)    TempSrc:      SpO2: 100%     Weight:      Height:        GEN- The patient is very ill appearing, intubated, sedated Head- normocephalic, atraumatic Eyes-  Sclera clear, conjunctiva pink Ears- unable to assess Oropharynx- ETT Neck- supple, R TLC in place Lungs- decreased BS, on vent Heart- Regular rate and rhythm  GI- soft, NT, ND, + BS Extremities- no clubbing, cyanosis, + dependant edema MS-age appropriate atrophy Skin- no rash or lesion Psych- unable to assess Neuro- unable to assess  EKG-  Complete heart block Labs:   Lab Results  Component Value Date   WBC 18.3 (H) 09/05/2018   HGB 12.5 09/05/2018   HCT 39.6 09/05/2018   MCV 88.4 09/05/2018   PLT 270 09/05/2018    Recent Labs  Lab 08/26/2018 1350  09/05/18 0421  NA 141   < > 141  K 4.4   < > 3.9  CL 100   < > 105  CO2 21*  --  28  BUN 17   < > 20  CREATININE 0.94   < > 0.59  CALCIUM 9.1  --  7.9*  PROT 6.3*  --   --   BILITOT 0.8  --   --   ALKPHOS 67  --   --   ALT 60*  --   --   AST 91*  --   --   GLUCOSE 288*   < > 174*   < > = values in this interval not displayed.   Lab Results  Component Value Date   TROPONINI 0.13 ([place]) 09/05/2018   No results found for: CHOL No results found for: HDL No results found for: LDLCALC No results found for: TRIG No results found for: CHOLHDL No results found for: LDLDIRECT     Cath :   Minimal non-obstructive CAD, EF 25%  ASSESSMENT AND PLAN:   1. Complete heart block Now resolved I have turned temp wire to backup VVI 40 bpm Keep temp wire in place Etiology is unclear.  Difficult to determine if complete heart block was primary or secondary.  We will continue to monitor rhythm closely.  I have spoken to Dr Haroldine Laws directly who agrees with this plan. Avoid AV nodal agents  2. EF 25% Per report, recent echo and stress testing in [place] was normal I suspect reduced EF may be secondary to  cardiac arrest.   Hopefully EF will recover.   reasess EF after she has clinically improved  3. Cardiac arrest Difficult to know if due to AV block or primarily PEA secondary to her advanced chronic lung disease.  No indication for an ICD.  May consider PPM if she clinically improves and depending on if she has further evidence of AV conduction disease. Primary team to manage vent. She is receiving cooling protocol currently EP to follow along.  Thompson Grayer, MD 09/05/2018  9:57 AM

## 2018-09-05 NOTE — Progress Notes (Signed)
eLink Physician-Brief Progress Note Patient Name: Sheri CanterSerena L Alvarez DOB: 02/21/1941 MRN: [id number]   Date of Service  09/05/2018  HPI/Events of Note    eICU Interventions  Troponin 0.18.  Follow levels as ordered.       Intervention Category Intermediate Interventions: Diagnostic test evaluation  Ranee GosselinKinila T Mohan 09/05/2018, 3:00 AM

## 2018-09-05 NOTE — Progress Notes (Signed)
This nurse pulled pt's femoral arterial sheath. Manual pressure was held for 20 min. All necessary supplies were at bedside. A second nurse was present for observation and available for any assistance. There were no complications and the site remained a level 0. Will continue to monitor the pt closely this shift.

## 2018-09-05 NOTE — Progress Notes (Signed)
Patient transported to CT and back to 2H-26 without complications. RN at bedside.

## 2018-09-05 NOTE — Progress Notes (Signed)
CT head and neck not completed overnight. This RN collaborated with RT and CT staff; Plan to take pt for imaging at approximately 0930-1000.

## 2018-09-05 NOTE — Consult Note (Signed)
NEURO HOSPITALIST CONSULT NOTE   Requestig physician: Dr. Halford Chessman  Reason for Consult: Anoxic brain injury  History obtained from:  Chart    HPI:                                                                                                                                          Sheri Alvarez is an 77 y.o. female with HTN and severe COPD on home oxygen who presented to the ED on Tuesday afternoon after collapsing at a nail salon with cardiac arrest. The arrest was preceded by sudden onset of dyspnea and then syncope. Bystander CPR was performed for approximately 7 minutes prior to arrival of fire department. On their arrival the patient had no pulses but no shock was advised. On arrival by EMS the patient was in PEA; EMS performed CPR for an additional 7 minutes with 2 of epi given prior to pulses being obtained. The patient then became bradycardic with HR 88 >> 28, which required pacing. On arrival to the ED the patient had palpable femoral pulses. A King airway had been placed by EMS. The patient was then intubated by the EDP.   Times from EMS: 1250 CPR started by bystander 1256 Fire arrives continues CPR 1359 EMS arrives, PEA, continue CPR 1306 Pt has pulses  ECG showed 3rd degree heart block. Labs showed lactate > 10 on arrival to the ED. Following intubation in the ED, the patient was taken to the cath lab for temporary pacer and LHC. Cooling protocol with target temp of 33 C was initiated.   CT head revealed no acute or traumatic finding. The appearance was normal for age.  An EEG was obtained this afternoon, revealing generalized interictal epileptiform discharges and generalized irregular slow activity consistent with diffuse cortical irritability. Although the findings represent an increased risk of seizure, no electrographic seizure was recorded on the study.  Past Medical History:  Diagnosis Date  . COPD (chronic obstructive pulmonary disease) ([place])    3L  home O2 24/7  . Hypertension     Past Surgical History:  Procedure Laterality Date  . cataract surgery    . HIP SURGERY  2018    No family history on file.            Social History:  reports that she quit smoking about 1 years ago. Her smoking use included cigarettes. She has a 25.00 pack-year smoking history. She has never used smokeless tobacco. She reports current alcohol use. She reports that she does not use drugs.  Allergies  Allergen Reactions  . Cephalexin Anaphylaxis    syncope    MEDICATIONS:  Scheduled: . artificial tears  1 application Both Eyes A3F  . atropine  1 mg Intravenous Once  . budesonide  0.25 mg Nebulization BID  . chlorhexidine gluconate (MEDLINE KIT)  15 mL Mouth Rinse BID  . Chlorhexidine Gluconate Cloth  6 each Topical Daily  . etomidate  20 mg Intravenous Once  . fentaNYL (SUBLIMAZE) injection  50 mcg Intravenous Once  . heparin  5,000 Units Subcutaneous Q8H  . insulin aspart  2-6 Units Subcutaneous Q4H  . ipratropium-albuterol  3 mL Nebulization Q6H  . mouth rinse  15 mL Mouth Rinse 10 times per day  . midazolam  1 mg Intravenous Once  . sodium chloride flush  10-40 mL Intracatheter Q12H  . succinylcholine  100 mg Intravenous Once   Continuous: . sodium chloride 50 mL/hr at 09/05/18 1000  . sodium chloride Stopped (09/05/18 0543)  . cisatracurium (NIMBEX) infusion 1 mcg/kg/min (09/05/18 1000)  . famotidine (PEPCID) IV 20 mg (09/05/18 1055)  . fentaNYL infusion INTRAVENOUS 200 mcg/hr (09/05/18 2047)  . midazolam (VERSED) infusion 2 mg/hr (09/05/18 1000)  . norepinephrine (LEVOPHED) Adult infusion 13 mcg/min (09/05/18 2013)     ROS:                                                                                                                                       Unable to obtain due to unresponsiveness.   Blood pressure  98/62, pulse 67, temperature (!) 91 F (32.8 C), temperature source Bladder, resp. rate 20, height '5\' 3"'  (1.6 m), weight 72.1 kg, SpO2 100 %.   General Examination:                                                                                                       Physical Exam  HEENT-  [place]/AT    Lungs- Intubated, on ventilator Extremities- Cool to touch in the context of cooling protocol  Neurological Examination Ment: In the context of cooling protocol with paralytic and sedation, eyes are closed and there are no responses to any external stimuli and no spontaneous movement.  CN: Sedated and on paralytic. No brainstem reflexes elicitable.  Motor: Flaccid tone x 4 in the context of paralytic.    Lab Results: Basic Metabolic Panel: Recent Labs  Lab 09/10/2018 1350  09/03/2018 2132 08/19/2018 2144 08/27/2018 2339 09/05/18 0047 09/05/18 0421 09/05/18 1138 09/05/18 1612  NA 141   < >  --  143 143  --  141 141 140  K 4.4   < >  --  2.9* 3.4*  --  3.9 3.6 3.3*  CL 100   < >  --  102 104  --  105 102 103  CO2 21*  --   --   --   --   --  28  --   --   GLUCOSE 288*   < >  --  192* 167*  --  174* 147* 125*  BUN 17   < >  --  22 23  --  20 29* 27*  CREATININE 0.94   < >  --  0.60 0.50 0.59 0.59 0.50 0.40*  CALCIUM 9.1  --   --   --   --   --  7.9*  --   --   MG  --   --  1.7  --   --   --  2.9*  --   --   PHOS  --   --   --   --   --   --  3.1  --   --    < > = values in this interval not displayed.    CBC: Recent Labs  Lab 08/28/2018 1350  09/02/2018 1747  08/15/2018 2144 08/29/2018 2339 09/05/18 0421 09/05/18 1138 09/05/18 1612  WBC 14.3*  --  18.0*  --   --   --  18.3*  --   --   NEUTROABS 7.3  --   --   --   --   --   --   --   --   HGB 12.8   < > 11.8*   < > 12.9 12.2 12.5 12.2 11.9*  HCT 44.6   < > 38.7   < > 38.0 36.0 39.6 36.0 35.0*  MCV 95.7  --  90.8  --   --   --  88.4  --   --   PLT 296  --  266  --   --   --  270  --   --    < > = values in this interval not displayed.     Cardiac Enzymes: Recent Labs  Lab 09/02/2018 1747 09/05/18 0047 09/05/18 0421 09/05/18 0830  TROPONINI 0.21* 0.18* 0.16* 0.13*    Lipid Panel: No results for input(s): CHOL, TRIG, HDL, CHOLHDL, VLDL, LDLCALC in the last 168 hours.  Imaging: Ct Head Wo Contrast  Result Date: 09/05/2018 CLINICAL DATA:  Cardiac arrest. Loss of consciousness. EXAM: CT HEAD WITHOUT CONTRAST CT CERVICAL SPINE WITHOUT CONTRAST TECHNIQUE: Multidetector CT imaging of the head and cervical spine was performed following the standard protocol without intravenous contrast. Multiplanar CT image reconstructions of the cervical spine were also generated. COMPARISON:  None. FINDINGS: CT HEAD FINDINGS Brain: The brain does not show accelerated atrophy for age. No sign of old or acute focal infarction, mass lesion, hemorrhage, hydrocephalus or extra-axial collection. Vascular: There is atherosclerotic calcification of the major vessels at the base of the brain. Skull: Normal Sinuses/Orbits: Clear/normal Other: None CT CERVICAL SPINE FINDINGS Alignment: No significant malalignment. Skull base and vertebrae: No fracture or primary bone lesion. Soft tissues and spinal canal: Negative Disc levels: Chronic degenerative spondylosis from C2-3 through C6-7. Osteophytic encroachment upon the canal and foramina, most pronounced on the left at C4-5 and C6-7. Upper chest: Emphysema and scarring. Small effusions. Other: None IMPRESSION: 1. Head CT: No acute or traumatic finding. Normal for age. 2. Cervical spine CT: No acute or traumatic finding. Chronic degenerative spondylosis. Electronically Signed   By: Elta Guadeloupe  Shogry M.D.   On: 09/05/2018 10:51   Ct Cervical Spine Wo Contrast  Result Date: 09/05/2018 CLINICAL DATA:  Cardiac arrest. Loss of consciousness. EXAM: CT HEAD WITHOUT CONTRAST CT CERVICAL SPINE WITHOUT CONTRAST TECHNIQUE: Multidetector CT imaging of the head and cervical spine was performed following the standard protocol  without intravenous contrast. Multiplanar CT image reconstructions of the cervical spine were also generated. COMPARISON:  None. FINDINGS: CT HEAD FINDINGS Brain: The brain does not show accelerated atrophy for age. No sign of old or acute focal infarction, mass lesion, hemorrhage, hydrocephalus or extra-axial collection. Vascular: There is atherosclerotic calcification of the major vessels at the base of the brain. Skull: Normal Sinuses/Orbits: Clear/normal Other: None CT CERVICAL SPINE FINDINGS Alignment: No significant malalignment. Skull base and vertebrae: No fracture or primary bone lesion. Soft tissues and spinal canal: Negative Disc levels: Chronic degenerative spondylosis from C2-3 through C6-7. Osteophytic encroachment upon the canal and foramina, most pronounced on the left at C4-5 and C6-7. Upper chest: Emphysema and scarring. Small effusions. Other: None IMPRESSION: 1. Head CT: No acute or traumatic finding. Normal for age. 2. Cervical spine CT: No acute or traumatic finding. Chronic degenerative spondylosis. Electronically Signed   By: Nelson Chimes M.D.   On: 09/05/2018 10:51   Dg Chest Port 1 View  Result Date: 09/05/2018 CLINICAL DATA:  Respiratory failure. EXAM: PORTABLE CHEST 1 VIEW COMPARISON:  08/20/2018. FINDINGS: 0705 hours. Endotracheal tube tip is 4.1 cm above the base of the carina. The NG tube passes into the stomach although the distal tip position is not included on the film. Right IJ central line tip overlies the mid to distal SVC level. Lungs are hyperexpanded. Interstitial markings are diffusely coarsened with chronic features. Patchy airspace disease with left upper lung predominance is similar to prior. Bones are diffusely demineralized. Telemetry leads overlie the chest. IMPRESSION: No substantial interval change in exam. Electronically Signed   By: Misty Stanley M.D.   On: 09/05/2018 08:57   Dg Chest Portable 1 View  Result Date: 08/14/2018 CLINICAL DATA:  Post CPR,  history COPD, CHF, former smoker EXAM: PORTABLE CHEST 1 VIEW COMPARISON:  Portable exam 1354 hours compared 04/20/2018 FINDINGS: Tip of endotracheal tube projects 5.2 cm above carina. External pacing leads project over chest. Normal heart size, mediastinal contours, and pulmonary vascularity. Atherosclerotic calcification aorta. Emphysematous changes with mild bibasilar atelectasis. Hazy infiltrates likely edema throughout LEFT lung. No pleural effusion or pneumothorax. Bones demineralized. Age-indeterminate fracture of the lateral LEFT fourth rib. IMPRESSION: COPD changes with bibasilar atelectasis and mild diffuse LEFT lung infiltrate question edema. Electronically Signed   By: Lavonia Dana M.D.   On: 09/07/2018 14:59    Assessment: 77 year old female status post cardiac arrest.  1. Possible anoxic brain injury.  2. Initial exam on paralytic due to cooling protocol, is uninformative. 3. EEG showed epileptiform discharges but no electrographic seizure.  4. CT was normal for age.   Recommendations: 1. After the patient is rewarmed and off paralytic/sedation, call Neurology for repeat exam.  2. MRI brain 3. Start Keppra with 1500 mg IV load followed by 1000 mg IV BID (ordered)  35 minutes spent in the Neurological evaluation and management of this critically ill patient. Time spent included discussion of her presentation with family and review of images.   Electronically signed: Dr. Kerney Elbe 09/05/2018, 8:41 PM

## 2018-09-05 NOTE — Progress Notes (Signed)
  Echocardiogram 2D Echocardiogram has been performed.  Delcie RochENNINGTON, Josehua Hammar 09/05/2018, 2:01 PM

## 2018-09-06 ENCOUNTER — Encounter (HOSPITAL_COMMUNITY): Payer: Self-pay | Admitting: Internal Medicine

## 2018-09-06 ENCOUNTER — Inpatient Hospital Stay (HOSPITAL_COMMUNITY): Payer: Medicare Other

## 2018-09-06 LAB — BASIC METABOLIC PANEL
ANION GAP: 9 (ref 5–15)
ANION GAP: 8 (ref 5–15)
ANION GAP: 9 (ref 5–15)
ANION GAP: 8 (ref 5–15)
Anion gap: 8 (ref 5–15)
Anion gap: 7 (ref 5–15)
BUN: 20 mg/dL (ref 8–23)
BUN: 19 mg/dL (ref 8–23)
BUN: 19 mg/dL (ref 8–23)
BUN: 20 mg/dL (ref 8–23)
BUN: 20 mg/dL (ref 8–23)
BUN: 20 mg/dL (ref 8–23)
CO2: 25 mmol/L (ref 22–32)
CO2: 25 mmol/L (ref 22–32)
CO2: 25 mmol/L (ref 22–32)
CO2: 25 mmol/L (ref 22–32)
CO2: 23 mmol/L (ref 22–32)
CO2: 23 mmol/L (ref 22–32)
Calcium: 7.8 mg/dL — ABNORMAL LOW (ref 8.9–10.3)
Calcium: 7.8 mg/dL — ABNORMAL LOW (ref 8.9–10.3)
Calcium: 7.7 mg/dL — ABNORMAL LOW (ref 8.9–10.3)
Calcium: 7.7 mg/dL — ABNORMAL LOW (ref 8.9–10.3)
Calcium: 7.8 mg/dL — ABNORMAL LOW (ref 8.9–10.3)
Calcium: 7.9 mg/dL — ABNORMAL LOW (ref 8.9–10.3)
Chloride: 106 mmol/L (ref 98–111)
Chloride: 105 mmol/L (ref 98–111)
Chloride: 105 mmol/L (ref 98–111)
Chloride: 105 mmol/L (ref 98–111)
Chloride: 108 mmol/L (ref 98–111)
Chloride: 108 mmol/L (ref 98–111)
Creatinine, Ser: 0.57 mg/dL (ref 0.44–1.00)
Creatinine, Ser: 0.64 mg/dL (ref 0.44–1.00)
Creatinine, Ser: 0.63 mg/dL (ref 0.44–1.00)
Creatinine, Ser: 0.71 mg/dL (ref 0.44–1.00)
Creatinine, Ser: 0.78 mg/dL (ref 0.44–1.00)
Creatinine, Ser: 0.8 mg/dL (ref 0.44–1.00)
GFR calc Af Amer: >60 mL/min (ref >60)
GFR calc Af Amer: >60 mL/min (ref >60)
GFR calc Af Amer: >60 mL/min (ref >60)
GFR calc Af Amer: >60 mL/min (ref >60)
GFR calc Af Amer: >60 mL/min (ref >60)
GFR calc non Af Amer: >60 mL/min (ref >60)
GFR calc non Af Amer: >60 mL/min (ref >60)
GFR calc non Af Amer: >60 mL/min (ref >60)
GFR calc non Af Amer: >60 mL/min (ref >60)
GFR calc non Af Amer: >60 mL/min (ref >60)
GLUCOSE: 140 mg/dL — AB (ref 70–99)
Glucose, Bld: 148 mg/dL — ABNORMAL HIGH (ref 70–99)
Glucose, Bld: 141 mg/dL — ABNORMAL HIGH (ref 70–99)
Glucose, Bld: 129 mg/dL — ABNORMAL HIGH (ref 70–99)
Glucose, Bld: 108 mg/dL — ABNORMAL HIGH (ref 70–99)
Glucose, Bld: 103 mg/dL — ABNORMAL HIGH (ref 70–99)
POTASSIUM: 3 mmol/L — AB (ref 3.5–5.1)
POTASSIUM: 3.2 mmol/L — AB (ref 3.5–5.1)
POTASSIUM: 4.6 mmol/L (ref 3.5–5.1)
Potassium: 3.7 mmol/L (ref 3.5–5.1)
Potassium: 3.2 mmol/L — ABNORMAL LOW (ref 3.5–5.1)
Potassium: 4.5 mmol/L (ref 3.5–5.1)
Sodium: 140 mmol/L (ref 135–145)
Sodium: 138 mmol/L (ref 135–145)
Sodium: 138 mmol/L (ref 135–145)
Sodium: 139 mmol/L (ref 135–145)
Sodium: 138 mmol/L (ref 135–145)
Sodium: 139 mmol/L (ref 135–145)

## 2018-09-06 LAB — CBC
HCT: 42.1 % (ref 36.0–46.0)
Hemoglobin: 13 g/dL (ref 12.0–15.0)
MCH: 27.2 pg (ref 26.0–34.0)
MCHC: 30.9 g/dL (ref 30.0–36.0)
MCV: 88.1 fL (ref 80.0–100.0)
NRBC: 0 % (ref 0.0–0.2)
Platelets: 220 10*3/uL (ref 150–400)
RBC: 4.78 MIL/uL (ref 3.87–5.11)
RDW: 12.9 % (ref 11.5–15.5)
WBC: 15.6 10*3/uL — ABNORMAL HIGH (ref 4.0–10.5)

## 2018-09-06 LAB — POCT I-STAT 3, ART BLOOD GAS (G3+)
ACID-BASE DEFICIT: 3 mmol/L — AB (ref 0.0–2.0)
Bicarbonate: 28.2 mmol/L — ABNORMAL HIGH (ref 20.0–28.0)
O2 Saturation: 100 %
TCO2: 31 mmol/L (ref 22–32)
pCO2 arterial: 87.7 mmHg (ref 32.0–48.0)
pH, Arterial: 7.116 — CL (ref 7.350–7.450)
pO2, Arterial: 343 mmHg — ABNORMAL HIGH (ref 83.0–108.0)

## 2018-09-06 LAB — MAGNESIUM
Magnesium: 2.1 mg/dL (ref 1.7–2.4)
Magnesium: 2 mg/dL (ref 1.7–2.4)

## 2018-09-06 LAB — POCT I-STAT, CHEM 8
BUN: 20 mg/dL (ref 8–23)
CHLORIDE: 103 mmol/L (ref 98–111)
Calcium, Ion: 1.18 mmol/L (ref 1.15–1.40)
Creatinine, Ser: 0.5 mg/dL (ref 0.44–1.00)
Glucose, Bld: 128 mg/dL — ABNORMAL HIGH (ref 70–99)
HEMATOCRIT: 38 % (ref 36.0–46.0)
Hemoglobin: 12.9 g/dL (ref 12.0–15.0)
Potassium: 3.2 mmol/L — ABNORMAL LOW (ref 3.5–5.1)
Sodium: 139 mmol/L (ref 135–145)
TCO2: 25 mmol/L (ref 22–32)

## 2018-09-06 LAB — GLUCOSE, CAPILLARY
Glucose-Capillary: 117 mg/dL — ABNORMAL HIGH (ref 70–99)
Glucose-Capillary: 126 mg/dL — ABNORMAL HIGH (ref 70–99)
Glucose-Capillary: 129 mg/dL — ABNORMAL HIGH (ref 70–99)
Glucose-Capillary: 140 mg/dL — ABNORMAL HIGH (ref 70–99)
Glucose-Capillary: 94 mg/dL (ref 70–99)

## 2018-09-06 LAB — PHOSPHORUS
Phosphorus: 5 mg/dL — ABNORMAL HIGH (ref 2.5–4.6)
Phosphorus: 5.2 mg/dL — ABNORMAL HIGH (ref 2.5–4.6)

## 2018-09-06 MED ORDER — ACETAMINOPHEN 160 MG/5ML PO SOLN
650.0000 mg | ORAL | Status: DC | PRN
  Administered 2018-09-06 – 2018-09-10 (×11): 650 mg
  Filled 2018-09-06 (×11): qty 20.3

## 2018-09-06 MED ORDER — BUDESONIDE 0.25 MG/2ML IN SUSP
0.5000 mg | Freq: Two times a day (BID) | RESPIRATORY_TRACT | Status: DC
  Administered 2018-09-06 – 2018-09-10 (×9): 0.5 mg via RESPIRATORY_TRACT
  Filled 2018-09-06 (×11): qty 4

## 2018-09-06 MED ORDER — CHLORHEXIDINE GLUCONATE CLOTH 2 % EX PADS
6.0000 | MEDICATED_PAD | Freq: Every day | CUTANEOUS | Status: DC
  Administered 2018-09-07 – 2018-09-11 (×5): 6 via TOPICAL

## 2018-09-06 MED ORDER — SODIUM CHLORIDE 0.9 % IV BOLUS
250.0000 mL | Freq: Once | INTRAVENOUS | Status: AC
  Administered 2018-09-06: 250 mL via INTRAVENOUS

## 2018-09-06 MED ORDER — VITAL HIGH PROTEIN PO LIQD
1000.0000 mL | ORAL | Status: DC
  Administered 2018-09-06 – 2018-09-11 (×4): 1000 mL

## 2018-09-06 MED ORDER — POTASSIUM CHLORIDE 20 MEQ/15ML (10%) PO SOLN
40.0000 meq | Freq: Once | ORAL | Status: AC
  Administered 2018-09-06: 40 meq
  Filled 2018-09-06: qty 30

## 2018-09-06 MED FILL — Heparin Sod (Porcine)-NaCl IV Soln 1000 Unit/500ML-0.9%: INTRAVENOUS | Qty: 500 | Status: AC

## 2018-09-06 NOTE — Progress Notes (Signed)
Initial Nutrition Assessment  DOCUMENTATION CODES:   Not applicable  INTERVENTION:   Tube Feeding:  Vital High Protein @ 55 ml/hr Provide 1320 kcals, 116 g of protein and 1069 mL of free water Meets 100% estimated calorie and protein needs    NUTRITION DIAGNOSIS:   Inadequate oral intake related to acute illness as evidenced by NPO status.  GOAL:   Patient will meet greater than or equal to 90% of their needs  MONITOR:   Vent status, Labs, Weight trends, TF tolerance  REASON FOR ASSESSMENT:   Ventilator    ASSESSMENT:   77 yo female admitted post cardiac arrest with 3rd degree heart block started on TTM, acute on chronic respiratory failure requiring vent support.  PMH includes severe COPD on home oxygen   Patient is currently intubated on ventilator support TTM, rewarmed, off nimbex MV: 9.3 L/min Temp (24hrs), Avg:94.7 F (34.8 C), Min:91 F (32.8 C), Max:99.1 F (37.3 C)  No family at bedside; unable to obtain diet and weight history. Per weight encounters, weight has appears to be trending down. Pt weighed 68.9 kg in August, 66.7 kg in  September, 65.5 kg in November and weighed 63.9 kg on admission. 7.2 % wt loss in 4-5 months. Current wt 72.1 kg. Net + 4 L per I/O flow sheet  Labs: reviewed Meds: NS at 75 ml/hr    NUTRITION - FOCUSED PHYSICAL EXAM:    Most Recent Value  Orbital Region  No depletion  Upper Arm Region  No depletion  Thoracic and Lumbar Region  No depletion  Buccal Region  Unable to assess  Temple Region  No depletion  Clavicle Bone Region  No depletion  Clavicle and Acromion Bone Region  No depletion  Scapular Bone Region  Unable to assess  Dorsal Hand  No depletion  Patellar Region  Unable to assess  Anterior Thigh Region  Unable to assess  Posterior Calf Region  No depletion  Edema (RD Assessment)  Mild       Diet Order:   Diet Order            Diet NPO time specified  Diet effective now              EDUCATION NEEDS:    Not appropriate for education at this time  Skin:  Skin Assessment: Reviewed RN Assessment  Last BM:  no documented BM  Height:   Ht Readings from Last 1 Encounters:  09/08/2018 5\' 3"  (1.6 m)    Weight:   Wt Readings from Last 1 Encounters:  09/05/18 72.1 kg    BMI:  Body mass index is 28.16 kg/m.  Estimated Nutritional Needs:   Kcal:  1315 kcals   Protein:  105-145 g   Fluid:  >/= 1.5 L   Romelle Starcherate Zoua Caporaso MS, RD, LDN, CNSC [phone] Pager  [phone] Weekend/On-Call Pager

## 2018-09-06 NOTE — Progress Notes (Addendum)
Patient was repositioned to her right side with pillow/wedge support and BUE elevated on pillows. Sats dropped to 88%.  ET suctioned to remove moderate amount of tan/pink tinged sputum.  Sats increased briefly to 90% and then decreased again to 88%.  Notified RRT who came to the room..Marland Kitchen

## 2018-09-06 NOTE — Progress Notes (Addendum)
NAME:  Sheri CanterSerena L Mifflin, MRN:  [id number], DOB:  02/10/1941, LOS: 2 ADMISSION DATE:  08/22/2018, CONSULTATION DATE: September 04, 2018 REFERRING MD:  Dr Silverio LayYao, CHIEF COMPLAINT: Cardiac arrest  Brief History   77 year old female with history of COPD on home oxygen with witnessed PEA arrest and 16 minutes of CPR.  ROSC to complete heart block taken to Cath Lab for transvenous pacing.  History of present illness   77 year old female with past medical history as below, which is significant for COPD on home O2 and  Hypertension.  She is followed by Dr. Elesa MassedWard the pulmonary clinic who notes that on her best days she cannot walk 100 yards without having to stop due to shortness of breath.  She broke her hip about a year ago and had a replacement and since that time is been unable to lower on her own.  Her daughter had been living with her and reading these were recently high they have come back to [place].  In the morning of 12/24 the patient had no complaints and went about her morning as usual.  She went to the nail salon with her daughter where upon entering she complained of shortness of breath and very shortly after collapsed and was pulseless.  CPR was nearly immediately started by daughter and continued once fire and EMS arrived.  Initial rhythm was PEA and ACLS was done for about 60 minutes prior to ROSC.  Rhythm was then bradycardic and found to be complete heart block.  Transcutaneous pacing was initiated.  Ice packs were applied in the emergency department and cardiology consulted there.  She was taken to the cardiac catheterization lab to have a transvenous pacer placed and potentially further diagnostic work-up.  PCCM was asked to admit.  Past Medical History  COPD, HTN  Significant Hospital Events   12/24 admit, Intubated, ICU, Transvenous pacer.  Consults:  Cardiology  Procedures:  Transvenous pacemaker 12/44 > ETT 12/24 >  Significant Diagnostic Tests:  CT head 12/24 > no acute  findings. EEG 12/24 > no seizures recorded (but did have cortical irritability which places here at higher risk for seizures). Echo 12/25 > EF 35-40%, G1DD  Micro Data:  Blood 12/24 >  Urine 12/24 >   Antimicrobials:     Interim history/subjective:  Started rewarming at 2000 12/25. No acute events. Comfortable on vent this AM.  Objective   Blood pressure (!) 104/52, pulse 84, temperature (!) 96.1 F (35.6 C), resp. rate (!) 22, height 5\' 3"  (1.6 m), weight 72.1 kg, SpO2 99 %. CVP:  [0 mmHg-8 mmHg] 8 mmHg  Vent Mode: PRVC FiO2 (%):  [5 %-50 %] 50 % Set Rate:  [22 bmp] 22 bmp Vt Set:  [420 mL] 420 mL PEEP:  [5 cmH20] 5 cmH20 Plateau Pressure:  [15 cmH20-18 cmH20] 15 cmH20   Intake/Output Summary (Last 24 hours) at 09/06/2018 0745 Last data filed at 09/06/2018 [id number] Gross per 24 hour  Intake 3318.72 ml  Output 425 ml  Net 2893.72 ml   Filed Weights   08/16/2018 1501 09/05/18 0500  Weight: 63.9 kg 72.1 kg    Examination: General: Elderly female currently heavily sedated and neuromuscular blockade HEENT: [place] / AT. ETT in place Neuro: Currently sedated and on neuromuscular blockade CV: RRR, no M/R/G PULM: even/non-labored, lungs bilaterally diminished GI: Soft nontender faint bowel sounds Extremities: warm/dry, 1+ edema  Skin: no rashes or lesions   Resolved Hospital Problem list     Assessment & Plan:  Cardiac arrest: Initial rhythm was PEA. 16 minutes downtime with immediate bystander CPR. GCS 3 in the immediate post code period. Etiology of unclear. Family reports SOB was rather sudden and very quickly decompensated, leading me to believe this was not a primary respiratory arrest.  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% with  G1DD - Holding beta blockers - Cardiology and EP are following - Depending on neuro recovery, might need PPM placed  Inability to protect airway in post arrest setting - Full mechanical ventilatory support - Trend chest x-ray and ABG - Spontaneous breathing trials once warmed and off NMB  COPD without acute exacerbation - Broncho-dilators - No need for steroids  Shock liver: - Monitor LFTs  Hypokalemia - 40 mEq K per tube - Follow BMP  Concern for anoxic injury - Neuro following, recommends to repeat exam after NMB is complete - Continue Keppra per neuro  Global: Discussed GOC with family on admission. They want us to be aggressive in the initial post-arrest period, but understand her goals may need to be revisited if she should decline, or not show signs of some neurologic recovery after re-warmed.   Best practice:  Diet: NPO Pain/Anxiety/Delirium protocol (if indicated): Fent, versed, nimbex VAP protocol (if indicated): yes DVT prophylaxis: heparin / scd's GI prophylaxis: pepcid Glucose control: SSI Mobility: BR Code Status: Full Family Communication: family updated at bedside 12/26 Disposition: ICU, critically ill.    Rutherford Guysahul Kalani Baray, [place] - [facility] Pager: [phone]  or [phone] 09/06/2018, 7:57 AM

## 2018-09-06 NOTE — Progress Notes (Addendum)
Progress Note  Patient Name: Sheri Alvarez Date of Encounter: 09/06/2018  Primary Cardiologist: Dr. Randon Goldsmith and Dr. Elyn Peers of [facility]  Subjective   Intubated, sedated, paralyzed (re-warming is in progress)   Inpatient Medications    Scheduled Meds: . artificial tears  1 application Both Eyes V9D  . atropine  1 mg Intravenous Once  . budesonide  0.25 mg Nebulization BID  . chlorhexidine gluconate (MEDLINE KIT)  15 mL Mouth Rinse BID  . Chlorhexidine Gluconate Cloth  6 each Topical Daily  . etomidate  20 mg Intravenous Once  . fentaNYL (SUBLIMAZE) injection  50 mcg Intravenous Once  . heparin  5,000 Units Subcutaneous Q8H  . insulin aspart  2-6 Units Subcutaneous Q4H  . ipratropium-albuterol  3 mL Nebulization Q6H  . mouth rinse  15 mL Mouth Rinse 10 times per day  . midazolam  1 mg Intravenous Once  . sodium chloride flush  10-40 mL Intracatheter Q12H  . succinylcholine  100 mg Intravenous Once   Continuous Infusions: . sodium chloride 50 mL/hr at 09/06/18 0646  . sodium chloride 50 mL/hr at 09/06/18 0410  . cisatracurium (NIMBEX) infusion 1 mcg/kg/min (09/06/18 0646)  . famotidine (PEPCID) IV Stopped (09/05/18 2304)  . fentaNYL infusion INTRAVENOUS 200 mcg/hr (09/06/18 0646)  . levETIRAcetam    . midazolam (VERSED) infusion 2 mg/hr (09/06/18 0646)  . norepinephrine (LEVOPHED) Adult infusion 29 mcg/min (09/06/18 0646)   PRN Meds: sodium chloride, acetaminophen (TYLENOL) oral liquid 160 mg/5 mL, albuterol, [COMPLETED] cisatracurium **AND** cisatracurium (NIMBEX) infusion **AND** cisatracurium, fentaNYL, midazolam, ondansetron (ZOFRAN) IV, sodium chloride flush   Vital Signs    Vitals:   09/06/18 0611 09/06/18 0615 09/06/18 0630 09/06/18 0644  BP:  (!) 105/54 (!) 104/52   Pulse: 81 83 84   Resp: (!) 22 (!) 22 (!) 22   Temp:    (!) 96.1 F (35.6 C)  TempSrc:      SpO2: 99% 98% 99%   Weight:      Height:        Intake/Output Summary (Last 24  hours) at 09/06/2018 0745 Last data filed at 09/06/2018 0646 Gross per 24 hour  Intake 3318.72 ml  Output 425 ml  Net 2893.72 ml   Filed Weights   08/29/2018 1501 09/05/18 0500  Weight: 63.9 kg 72.1 kg    Telemetry    SR, no bradycardia or arrhythmias noted, occ PACs, rare PVCs - Personally Reviewed  ECG    No new EKGs - Personally Reviewed  Physical Exam   GEN: intubated sedated, frail body habitus.   Neck: No JVD Cardiac: RRR, no murmurs, rubs, or gallops are appreciated.  Respiratory: Clear to auscultation bilaterally (ant/lat ausculation only) GI: Soft, nontender, non-distended  MS: No edema; No deformity. Neuro:  unable to assess Psych: unable to assess   Labs    Chemistry Recent Labs  Lab 08/20/2018 1350  09/06/18 0115 09/06/18 0300 09/06/18 0540  NA 141   < > 140 138 138  K 4.4   < > 3.0* 3.7 3.2*  CL 100   < > 106 105 105  CO2 21*   < > _0 GLUCOSE 288*   < > 148* 141* 140*  BUN 17   < > _1 CREATININE 0.94   < > 0.57 0.64 0.63  CALCIUM 9.1   < > 7.8* 7.8* 7.7*  PROT 6.3*  --   --   --   --  ALBUMIN 3.3*  --   --   --   --   AST 91*  --   --   --   --   ALT 60*  --   --   --   --   ALKPHOS 67  --   --   --   --   BILITOT 0.8  --   --   --   --   GFRNONAA 59*   < > >60 >60 >60  GFRAA >60   < > >60 >60 >60  ANIONGAP 20*   < > _0 < > = values in this interval not displayed.     Hematology Recent Labs  Lab 09/03/2018 1747  09/05/18 0421 09/05/18 1138 09/05/18 1612 09/06/18 0300  WBC 18.0*  --  18.3*  --   --  15.6*  RBC 4.26  --  4.48  --   --  4.78  HGB 11.8*   < > 12.5 12.2 11.9* 13.0  HCT 38.7   < > 39.6 36.0 35.0* 42.1  MCV 90.8  --  88.4  --   --  88.1  MCH 27.7  --  27.9  --   --  27.2  MCHC 30.5  --  31.6  --   --  30.9  RDW 12.6  --  12.6  --   --  12.9  PLT 266  --  270  --   --  220   < > = values in this interval not displayed.    Cardiac Enzymes Recent Labs  Lab 09/10/2018 1747 09/05/18 0047 09/05/18 0421  09/05/18 0830  TROPONINI 0.21* 0.18* 0.16* 0.13*    Recent Labs  Lab 08/15/2018 1400  TROPIPOC 0.03     BNPNo results for input(s): BNP, PROBNP in the last 168 hours.   DDimer No results for input(s): DDIMER in the last 168 hours.   Radiology    Ct Head Wo Contrast Result Date: 09/05/2018 CLINICAL DATA:  Cardiac arrest. Loss of consciousness. EXAM: CT HEAD WITHOUT CONTRAST CT CERVICAL SPINE WITHOUT CONTRAST TECHNIQUE: Multidetector CT imaging of the head and cervical spine was performed following the standard protocol without intravenous contrast. Multiplanar CT image reconstructions of the cervical spine were also generated. COMPARISON:  None. FINDINGS: CT HEAD FINDINGS Brain: The brain does not show accelerated atrophy for age. No sign of old or acute focal infarction, mass lesion, hemorrhage, hydrocephalus or extra-axial collection. Vascular: There is atherosclerotic calcification of the major vessels at the base of the brain. Skull: Normal Sinuses/Orbits: Clear/normal Other: None CT CERVICAL SPINE FINDINGS Alignment: No significant malalignment. Skull base and vertebrae: No fracture or primary bone lesion. Soft tissues and spinal canal: Negative Disc levels: Chronic degenerative spondylosis from C2-3 through C6-7. Osteophytic encroachment upon the canal and foramina, most pronounced on the left at C4-5 and C6-7. Upper chest: Emphysema and scarring. Small effusions. Other: None IMPRESSION: 1. Head CT: No acute or traumatic finding. Normal for age. 2. Cervical spine CT: No acute or traumatic finding. Chronic degenerative spondylosis. Electronically Signed   By: Nelson Chimes M.D.   On: 09/05/2018 10:51    Dg Chest Port 1 View Result Date: 09/05/2018 CLINICAL DATA:  Respiratory failure. EXAM: PORTABLE CHEST 1 VIEW COMPARISON:  09/03/2018. FINDINGS: 0705 hours. Endotracheal tube tip is 4.1 cm above the base of the carina. The NG tube passes into the stomach although the distal tip position is not  included on the film. Right IJ  central line tip overlies the mid to distal SVC level. Lungs are hyperexpanded. Interstitial markings are diffusely coarsened with chronic features. Patchy airspace disease with left upper lung predominance is similar to prior. Bones are diffusely demineralized. Telemetry leads overlie the chest. IMPRESSION: No substantial interval change in exam. Electronically Signed   By: Misty Stanley M.D.   On: 09/05/2018 08:57      Cardiac Studies   09/05/18: TTE Study Conclusions - Left ventricle: The cavity size was normal. Systolic function was   moderately reduced. The estimated ejection fraction was in the   range of 35% to 40%. Diffuse hypokinesis. Although no diagnostic   regional wall motion abnormality was identified, this possibility   cannot be completely excluded on the basis of this study. Doppler   parameters are consistent with abnormal left ventricular   relaxation (grade 1 diastolic dysfunction). Indeterminate filling   pressure. - Aortic valve: Transvalvular velocity was within the normal range.   There was no stenosis. There was no regurgitation. - Mitral valve: Transvalvular velocity was within the normal range.   There was no evidence for stenosis. There was trivial   regurgitation. - Right ventricle: The cavity size was mildly to moderately   dilated. Wall thickness was normal. Systolic function was normal. - Tricuspid valve: There was trivial regurgitation. - Inferior vena cava: The vessel was dilated. Patient is intubated.   Femoral pacing wire noted in IVC. - Pericardium, extracardiac: There was no pericardial effusion. Impressions: - LVEF 35-40% with diffuse hypokinesis, mild-moderate RV dilatation   with normal systolic function. No significant valvular heart   disease. No pericardial effusion.   08/13/2018: LHC, temp pacing wire placement  Prox LAD to Mid LAD lesion is 20% stenosed.  Prox RCA to Mid RCA lesion is 30% stenosed. 1.  Successful placement of temporary venous pacer due to asystolic arrest 2. Minimal non-obstructive CAD 3. EF ~ 25% with severe hypokinesis of the mid to distal ventricle. 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 female with PMHx of O2 dependent COPD and HTN only visiting in [place] where she suffered what was reported as a PEA arrest. Received bystander CPR, and by chart record after epinephrine and CPR had ROSC after approximately 16 minutes.  She was transferred to [facility]   She arrived intubated, with a King's airway, she was in CHB with V rates 20's, being transcutaneously paced, was brought emergently to cath , temp pacing wire support placed, LHC noted with no obstructive CAD, LVEF 25%, severe hypokinesis mid-distal LV, ? If Tako-tsubo cardiomyopathy vs non-ischemic CM.  She was cooled, rewarming is in-progress this AM  The patient's daughter at bedside states that her mother reported while in the car an acute onset of trouble breathing far beyond her baseline, no complaints of dizziness, weakness, near syncope, no CP. She reports the patient was recently to have had a routine colonoscopy though "had trouble getting her vital signs, put her on a heart monitor with an abnormality she recalls being told "d-fib" her mom reported no associated symptoms, colonoscopy cancelled for cardiology evaluation.  She underwent 48 hour monitoring, echo and stress test, all being reported to her as "normal", though had some fluid on one of her lungs and started on carvedilol (3.193m BID), maxide and lasix. (this in the past couple weeks)  Assessment & Plan    1. PEA arrest          *As above s/p temp pacing  wire support (no pacing noted/needed since 0930 yesterday AM) *No obstructive CAD *NICM vs Tako-tsubo cardiomyopathy, LVEF by LHC on arrival approx 25% w/WMA, TTE yesterday 35%-40%, diffuse hypokinesis (though mentions WMA could not be r/o by the exam) *  remains intubated, sedated, paralyzed, rewarming in progress * she has had increasing pressor requirements over night * Clinical Interpretation: This EEG is consistent with cortical irritability that is generalized in nature. This does represent an increased risk of seizure, but no seizure was recorded on this study.  Unclear if she had a primary respiratory event that triggered PEA arrest, if CHB was a consequence of this. She has not had any pacing needs for nearly 24 hours now.  Our thoughts are at this juncture to follow clinically, pending her recovery going forward regarding decisions on device implantation.      For questions or updates, please contact [place] Please consult [email or web address] for contact info under        Signed, Baldwin Jamaica, PA-C  09/06/2018, 7:45 AM    I have seen, examined the patient, and reviewed the above assessment and plan.  Changes to above are made where necessary.  On exam, unresponsive.  RRR.  Her AV conduction appears to have returned to normal.  The true cause of her AV block is unknown.  Keep temp wire in place for now and reassess in am.  Very complicated patient.  A high level of decision making was required for this encounter.  Co Sign: Thompson Grayer, MD 09/06/2018 2:06 PM

## 2018-09-06 NOTE — Progress Notes (Signed)
CDS contacted at 12/26 @ 0440 am Referral number- [id number], CDS rep- Earlie Louimothy Shore

## 2018-09-06 NOTE — Progress Notes (Signed)
   09/06/18 1500  Clinical Encounter Type  Visited With Patient and family together  Visit Type Follow-up  Referral From Other (Comment)  Consult/Referral To Chaplain  Spiritual Encounters  Spiritual Needs Emotional;Prayer  Stress Factors  Patient Stress Factors None identified  Family Stress Factors Health changes   Followed up on previous visits with PT and family. Daughter was at bedside and PT was unresponsive. Nurse was in room and very professional while treating PT. Daughter was thankful for the follow up visit. I offered spiritual care with emotional support, ministry of presence, words or encouragement and prayer. Chaplain follow up as needed.  Chaplain Orest DikesAbel Kaila Devries  [phone]

## 2018-09-07 ENCOUNTER — Inpatient Hospital Stay (HOSPITAL_COMMUNITY): Payer: Medicare Other

## 2018-09-07 LAB — HEPATIC FUNCTION PANEL
ALT: 59 U/L — ABNORMAL HIGH (ref 0–44)
AST: 69 U/L — ABNORMAL HIGH (ref 15–41)
Albumin: 2.4 g/dL — ABNORMAL LOW (ref 3.5–5.0)
Alkaline Phosphatase: 86 U/L (ref 38–126)
BILIRUBIN DIRECT: 0.2 mg/dL (ref 0.0–0.2)
Indirect Bilirubin: 0.3 mg/dL (ref 0.3–0.9)
Total Bilirubin: 0.5 mg/dL (ref 0.3–1.2)
Total Protein: 5.4 g/dL — ABNORMAL LOW (ref 6.5–8.1)

## 2018-09-07 LAB — CBC
HCT: 39.4 % (ref 36.0–46.0)
Hemoglobin: 11.7 g/dL — ABNORMAL LOW (ref 12.0–15.0)
MCH: 27 pg (ref 26.0–34.0)
MCHC: 29.7 g/dL — ABNORMAL LOW (ref 30.0–36.0)
MCV: 91 fL (ref 80.0–100.0)
PLATELETS: 215 10*3/uL (ref 150–400)
RBC: 4.33 MIL/uL (ref 3.87–5.11)
RDW: 13.4 % (ref 11.5–15.5)
WBC: 14.3 10*3/uL — ABNORMAL HIGH (ref 4.0–10.5)
nRBC: 0 % (ref 0.0–0.2)

## 2018-09-07 LAB — POCT I-STAT 3, ART BLOOD GAS (G3+)
Acid-base deficit: 5 mmol/L — ABNORMAL HIGH (ref 0.0–2.0)
Acid-base deficit: 5 mmol/L — ABNORMAL HIGH (ref 0.0–2.0)
Acid-base deficit: 4 mmol/L — ABNORMAL HIGH (ref 0.0–2.0)
Acid-base deficit: 3 mmol/L — ABNORMAL HIGH (ref 0.0–2.0)
BICARBONATE: 24.5 mmol/L (ref 20.0–28.0)
Bicarbonate: 25.1 mmol/L (ref 20.0–28.0)
Bicarbonate: 25.2 mmol/L (ref 20.0–28.0)
Bicarbonate: 24.1 mmol/L (ref 20.0–28.0)
O2 Saturation: 98 %
O2 Saturation: 93 %
O2 Saturation: 95 %
O2 Saturation: 92 %
PH ART: 7.203 — AB (ref 7.350–7.450)
PO2 ART: 89 mmHg (ref 83.0–108.0)
Patient temperature: 37.3
Patient temperature: 36.9
Patient temperature: 37.1
TCO2: 27 mmol/L (ref 22–32)
TCO2: 27 mmol/L (ref 22–32)
TCO2: 27 mmol/L (ref 22–32)
TCO2: 26 mmol/L (ref 22–32)
pCO2 arterial: 70.2 mmHg (ref 32.0–48.0)
pCO2 arterial: 71.4 mmHg (ref 32.0–48.0)
pCO2 arterial: 64 mmHg — ABNORMAL HIGH (ref 32.0–48.0)
pCO2 arterial: 54 mmHg — ABNORMAL HIGH (ref 32.0–48.0)
pH, Arterial: 7.153 — CL (ref 7.350–7.450)
pH, Arterial: 7.153 — CL (ref 7.350–7.450)
pH, Arterial: 7.258 — ABNORMAL LOW (ref 7.350–7.450)
pO2, Arterial: 145 mmHg — ABNORMAL HIGH (ref 83.0–108.0)
pO2, Arterial: 94 mmHg (ref 83.0–108.0)
pO2, Arterial: 76 mmHg — ABNORMAL LOW (ref 83.0–108.0)

## 2018-09-07 LAB — GLUCOSE, CAPILLARY
Glucose-Capillary: 104 mg/dL — ABNORMAL HIGH (ref 70–99)
Glucose-Capillary: 107 mg/dL — ABNORMAL HIGH (ref 70–99)
Glucose-Capillary: 119 mg/dL — ABNORMAL HIGH (ref 70–99)
Glucose-Capillary: 132 mg/dL — ABNORMAL HIGH (ref 70–99)
Glucose-Capillary: 148 mg/dL — ABNORMAL HIGH (ref 70–99)
Glucose-Capillary: 96 mg/dL (ref 70–99)

## 2018-09-07 LAB — BASIC METABOLIC PANEL
Anion gap: 9 (ref 5–15)
BUN: 22 mg/dL (ref 8–23)
CO2: 22 mmol/L (ref 22–32)
Calcium: 8 mg/dL — ABNORMAL LOW (ref 8.9–10.3)
Chloride: 108 mmol/L (ref 98–111)
Creatinine, Ser: 0.7 mg/dL (ref 0.44–1.00)
GFR calc Af Amer: >60 mL/min (ref >60)
GFR calc non Af Amer: >60 mL/min (ref >60)
Glucose, Bld: 118 mg/dL — ABNORMAL HIGH (ref 70–99)
Potassium: 5.2 mmol/L — ABNORMAL HIGH (ref 3.5–5.1)
Sodium: 139 mmol/L (ref 135–145)

## 2018-09-07 LAB — MAGNESIUM
MAGNESIUM: 2.4 mg/dL (ref 1.7–2.4)
Magnesium: 1.9 mg/dL (ref 1.7–2.4)

## 2018-09-07 LAB — PHOSPHORUS
Phosphorus: 4.8 mg/dL — ABNORMAL HIGH (ref 2.5–4.6)
Phosphorus: 2.9 mg/dL (ref 2.5–4.6)

## 2018-09-07 LAB — LACTIC ACID, PLASMA: Lactic Acid, Venous: 1.3 mmol/L (ref 0.5–1.9)

## 2018-09-07 MED ORDER — MAGNESIUM SULFATE 2 GM/50ML IV SOLN
2.0000 g | Freq: Once | INTRAVENOUS | Status: AC
  Administered 2018-09-07: 2 g via INTRAVENOUS
  Filled 2018-09-07: qty 50

## 2018-09-07 MED ORDER — ALBUTEROL SULFATE (2.5 MG/3ML) 0.083% IN NEBU: 2.5000 mg | INHALATION_SOLUTION | RESPIRATORY_TRACT | Status: DC | PRN

## 2018-09-07 NOTE — Progress Notes (Signed)
Attempted to wean sedation throughout shift; unable d/t pt double-stacking/fighting the ventilator. Will continue to monitor.  Herma ArdMOSELEY, Maple Odaniel F, RN

## 2018-09-07 NOTE — Progress Notes (Signed)
eLink Physician-Brief Progress Note Patient Name: Sheri Alvarez DOB: 01/28/1941 MRN: [id number]   Date of Service  09/07/2018  HPI/Events of Note  ABG results show resp acidosis with pH 7.15/70/145/24  eICU Interventions  Plan: Increase RR to 30 Increase TV to 500 cc Repeat ABG at 7am Hold on bicarb pushes     Intervention Category Major Interventions: Acid-Base disturbance - evaluation and management  DETERDING,ELIZABETH 09/07/2018, 4:02 AM

## 2018-09-07 NOTE — Progress Notes (Signed)
NAME:  Sheri Alvarez, MRN:  [id number], DOB:  02/14/1941, LOS: 3 ADMISSION DATE:  09/09/2018, CONSULTATION DATE: September 04, 2018 REFERRING MD:  Dr Silverio LayYao, CHIEF COMPLAINT: Cardiac arrest  Brief History   77 year old female with history of COPD on home oxygen with witnessed PEA arrest and 16 minutes of CPR.  ROSC to complete heart block taken to Cath Lab for transvenous pacing.  History of present illness   77 year old female with past medical history as below, which is significant for COPD on home O2 and  Hypertension.  She is followed by Dr. Elesa MassedWard the pulmonary clinic who notes that on her best days she cannot walk 100 yards without having to stop due to shortness of breath.  She broke her hip about a year ago and had a replacement and since that time is been unable to lower on her own.  Her daughter had been living with her and reading these were recently high they have come back to [place].  In the morning of 12/24 the patient had no complaints and went about her morning as usual.  She went to the nail salon with her daughter where upon entering she complained of shortness of breath and very shortly after collapsed and was pulseless.  CPR was nearly immediately started by daughter and continued once fire and EMS arrived.  Initial rhythm was PEA and ACLS was done for about 60 minutes prior to ROSC.  Rhythm was then bradycardic and found to be complete heart block.  Transcutaneous pacing was initiated.  Ice packs were applied in the emergency department and cardiology consulted there.  She was taken to the cardiac catheterization lab to have a transvenous pacer placed and potentially further diagnostic work-up.  PCCM was asked to admit.  Past Medical History  COPD, HTN  Significant Hospital Events   12/24 admit, Intubated, ICU, Transvenous pacer.  Consults:  Cardiology  Procedures:  Transvenous pacemaker 12/24 > ETT 12/24 > L radial a line 12/24 >   Significant Diagnostic Tests:  CT  head 12/24 > no acute findings. EEG 12/24 > no seizures recorded (but did have cortical irritability which places here at higher risk for seizures). Echo 12/25 > EF 35-40%, G1DD Thyroid US 12/26 > 5.5cm right inferior thyroid nodule.  Meets criteria for elective FNA. CT head 12/27 >  EEG 12/27 >   Micro Data:  Blood 12/24 >  Urine 12/24 >   Antimicrobials:     Interim history/subjective:  S/p rewarming. Had ongoing respiratory acidosis overnight, vent adjusted accordingly. Repeat ABG this AM improved but still with some resp acidosis.  Vent already set at RR 30 with TV 500. Did not have purposeful movements after fentanyl weaned AM 12/27.  Objective   Blood pressure 103/64, pulse (!) 106, temperature 98.4 F (36.9 C), resp. rate (!) 26, height 5\' 3"  (1.6 m), weight 71.6 kg, SpO2 97 %. CVP:  [1 mmHg-19 mmHg] 13 mmHg  Vent Mode: PRVC FiO2 (%):  [40 %-75 %] 50 % Set Rate:  [22 bmp-30 bmp] 30 bmp Vt Set:  [420 mL-500 mL] 500 mL PEEP:  [5 cmH20] 5 cmH20 Plateau Pressure:  [12 cmH20-22 cmH20] 22 cmH20   Intake/Output Summary (Last 24 hours) at 09/07/2018 0905 Last data filed at 09/07/2018 0700 Gross per 24 hour  Intake 3555.3 ml  Output 980 ml  Net 2575.3 ml   Filed Weights   08/31/2018 1501 09/05/18 0500 09/07/18 0500  Weight: 63.9 kg 72.1 kg 71.6 kg  Examination:  General: Elderly female, in NAD HEENT: [place] / AT. ETT in place Neuro: Sedated, does not follow commands CV: RRR, no M/R/G PULM: even/non-labored, lungs bilaterally diminished GI: Soft nontender faint bowel sounds Extremities: warm/dry, 1+ edema  Skin: no rashes or lesions   Resolved Hospital Problem list     Assessment & Plan:   Cardiac arrest: Initial rhythm was PEA. 16 minutes downtime with immediate bystander CPR. GCS 3 in the immediate post code period. Etiology of unclear. Family reports SOB was rather sudden and very quickly decompensated, leading me to believe this was not a primary respiratory  arrest.  She is now s/p TTM at 33 degrees, re-warm completed 12/26. - Continue levophed as needed for goal MAP > 70 during TTM  Bradycardia/Complete heart block - s/p temp wire placement. Combined hear failure - echo 12/25 with EF 35-40% with G1DD. - Holding beta blockers. - Cardiology and EP are following. - Depending on neuro recovery, might need PPM placed.  Inability to protect airway in post arrest setting. Respiratory acidosis. - Full mechanical ventilatory support. - RR already at 30, will increase to 32.  Vt already at 500 which is already around her 10cc/kg; therefore, will not increase further. - Repeat ABG at 1045. - Spontaneous breathing trials only once respiratory acidosis has resolved and if mental status allows.  COPD without acute exacerbation. - Broncho-dilators. - No need for steroids.  Shock liver - gradually improving. - Monitor LFTs intermittently.  Hypokalemia - resolved. - Follow BMP.  Concern for anoxic injury - did not have any purposeful movement when fentanyl was weaned AM 12/27. - Neuro following, plan for repeat CT head and EEG today 12/27. - Continue Keppra per neuro.  Global: Discussed GOC with family on admission. They want us to be aggressive in the initial post-arrest period, but understand her goals may need to be revisited if she should decline, or not show signs of some neurologic recovery after re-warmed.   Best practice:  Diet: NPO Pain/Anxiety/Delirium protocol (if indicated): Fent, versed, nimbex VAP protocol (if indicated): yes DVT prophylaxis: heparin / scd's GI prophylaxis: pepcid Glucose control: SSI Mobility: BR Code Status: Full Family Communication: family updated at bedside 12/26 Disposition: ICU, critically ill.    Rutherford Guysahul , GeorgiaPA - C [facility] Pager: [phone]  or [phone] 09/07/2018, 9:05 AM

## 2018-09-07 NOTE — Progress Notes (Addendum)
Telemetry/chart reviewed. Remains intubated on Fentanyl ABG this AM with pH 7.1 Remains on levophed SR.ST on telemetry, rates 80s'-110, no bradycardia or AV block Temp pacing wire remains, back up for 40bpm.  EP service will continue to follow from afar as patient's clinical course progresses  Francis DowseEnee Ursuy, PA-c   AV conduction is now 1:1.   I think that we could safely remove her temp wire at this time, but would advise that we wait until acidosis is resolved (as this could have possibly been the initial cause for her AV block).  Her overall prognosis is very poor.    EP to follow along  Hillis RangeJames Chrystel Barefield MD, [facility] 09/07/2018 9:36 AM

## 2018-09-07 NOTE — Progress Notes (Signed)
EEG completed; results pending.    

## 2018-09-07 NOTE — Progress Notes (Signed)
Pt on the way to CT. Will attempt EEG again within 545mins-1hour/when schedule permits.

## 2018-09-07 NOTE — Procedures (Signed)
History: 77 year old female being evaluated for post anoxic prognosis  Sedation: None  Technique: This is a 21 channel routine scalp EEG performed at the bedside with bipolar and monopolar montages arranged in accordance to the international 10/20 system of electrode placement. One channel was dedicated to EKG recording.    Background: The background consists of generalized irregular delta activities with a paucity of faster frequencies.  There are occasional poorly formed frontally predominant discharges with triphasic morphology, but no epileptiform-appearing discharges on this exam.  Photic stimulation: Physiologic driving is not performed  EEG Abnormalities: 1) rare triphasic waves  2) generalized irregular slow activity 3) absent PDR  Clinical Interpretation: This EEG is consistent with a generalized nonspecific cerebral dysfunction (encephalopathy).    There was no seizure or seizure predisposition recorded on this study.  Compared to the previous study, on the study there are no epileptiform discharges.     Sheri SlotMcNeill Sheri Marrin, MD [facility] [phone]  If 7pm- 7am, please page neurology on call as listed in AMION.

## 2018-09-07 NOTE — Progress Notes (Signed)
Subjective: Off nimbex since yesterday, off sedation x 1 hour  Exam: Vitals:   09/07/18 0751 09/07/18 0759  BP:    Pulse:    Resp:    Temp:    SpO2: 94% 97%   Gen: In bed, intubated Resp: ventilated  Abd: soft, nt  Neuro: MS: does not open eyes or follow commands CN: PERRL, corneals intact Motor: no response to noxious stimulation Sensory:as above.   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 I would favor giving 72 hours post-rewarming prior to prognostication unless ancillary testing helps guide us prior to that.   Recommendations: 1) repeat EEG 2) repeat CT head 3) will follow.    Ritta SlotMcNeill Daveah Varone, MD [facility] [phone]  If 7pm- 7am, please page neurology on call as listed in AMION.

## 2018-09-07 NOTE — Progress Notes (Signed)
Critical ABG results called to eLink. Vent changes ordered. Will continue to monitor.  Herma ArdMOSELEY, Jin Shockley F, RN

## 2018-09-07 NOTE — Progress Notes (Signed)
Called critical ABG results to Dr. Darrick Pennaeterding; no new orders at this time. MD advised to increase sedation.  Will pass along to day team.  Herma ArdMOSELEY, Mea Ozga F, RN

## 2018-09-08 ENCOUNTER — Inpatient Hospital Stay (HOSPITAL_COMMUNITY): Payer: Medicare Other

## 2018-09-08 LAB — MAGNESIUM: Magnesium: 2.2 mg/dL (ref 1.7–2.4)

## 2018-09-08 LAB — POCT I-STAT 3, ART BLOOD GAS (G3+)
Acid-base deficit: 6 mmol/L — ABNORMAL HIGH (ref 0.0–2.0)
Bicarbonate: 22.9 mmol/L (ref 20.0–28.0)
O2 Saturation: 95 %
Patient temperature: 36.9
TCO2: 25 mmol/L (ref 22–32)
pCO2 arterial: 60.8 mmHg — ABNORMAL HIGH (ref 32.0–48.0)
pH, Arterial: 7.184 — CL (ref 7.350–7.450)
pO2, Arterial: 95 mmHg (ref 83.0–108.0)

## 2018-09-08 LAB — BASIC METABOLIC PANEL
Anion gap: 10 (ref 5–15)
BUN: 28 mg/dL — AB (ref 8–23)
CO2: 22 mmol/L (ref 22–32)
Calcium: 8.1 mg/dL — ABNORMAL LOW (ref 8.9–10.3)
Chloride: 109 mmol/L (ref 98–111)
Creatinine, Ser: 0.67 mg/dL (ref 0.44–1.00)
GFR calc Af Amer: >60 mL/min (ref >60)
GFR calc non Af Amer: >60 mL/min (ref >60)
Glucose, Bld: 82 mg/dL (ref 70–99)
Potassium: 5.2 mmol/L — ABNORMAL HIGH (ref 3.5–5.1)
SODIUM: 141 mmol/L (ref 135–145)

## 2018-09-08 LAB — CBC
HCT: 36.1 % (ref 36.0–46.0)
Hemoglobin: 10.8 g/dL — ABNORMAL LOW (ref 12.0–15.0)
MCH: 27.6 pg (ref 26.0–34.0)
MCHC: 29.9 g/dL — ABNORMAL LOW (ref 30.0–36.0)
MCV: 92.3 fL (ref 80.0–100.0)
Platelets: 177 10*3/uL (ref 150–400)
RBC: 3.91 MIL/uL (ref 3.87–5.11)
RDW: 13.9 % (ref 11.5–15.5)
WBC: 5 10*3/uL (ref 4.0–10.5)
nRBC: 0 % (ref 0.0–0.2)

## 2018-09-08 LAB — GLUCOSE, CAPILLARY
Glucose-Capillary: 162 mg/dL — ABNORMAL HIGH (ref 70–99)
Glucose-Capillary: 86 mg/dL (ref 70–99)
Glucose-Capillary: 94 mg/dL (ref 70–99)

## 2018-09-08 LAB — TRIGLYCERIDES: Triglycerides: 170 mg/dL — ABNORMAL HIGH (ref <150)

## 2018-09-08 LAB — PHOSPHORUS: Phosphorus: 3.5 mg/dL (ref 2.5–4.6)

## 2018-09-08 MED ORDER — SODIUM BICARBONATE 8.4 % IV SOLN
100.0000 meq | Freq: Once | INTRAVENOUS | Status: AC
  Administered 2018-09-08: 100 meq via INTRAVENOUS
  Filled 2018-09-08: qty 100

## 2018-09-08 MED ORDER — PROPOFOL 1000 MG/100ML IV EMUL
5.0000 ug/kg/min | INTRAVENOUS | Status: DC
  Administered 2018-09-08: 20 ug/kg/min via INTRAVENOUS
  Administered 2018-09-08: 5 ug/kg/min via INTRAVENOUS
  Administered 2018-09-09: 30 ug/kg/min via INTRAVENOUS
  Administered 2018-09-09: 20 ug/kg/min via INTRAVENOUS
  Administered 2018-09-10: 30 ug/kg/min via INTRAVENOUS
  Filled 2018-09-08 (×6): qty 100

## 2018-09-08 MED ORDER — STERILE WATER FOR INJECTION IV SOLN
INTRAVENOUS | Status: DC
  Administered 2018-09-08 – 2018-09-10 (×4): via INTRAVENOUS
  Filled 2018-09-08 (×10): qty 850

## 2018-09-08 MED ORDER — SODIUM ZIRCONIUM CYCLOSILICATE 5 G PO PACK
5.0000 g | PACK | Freq: Two times a day (BID) | ORAL | Status: DC
  Administered 2018-09-08 (×2): 5 g via ORAL
  Filled 2018-09-08 (×3): qty 1

## 2018-09-08 MED ORDER — FENTANYL CITRATE (PF) 100 MCG/2ML IJ SOLN
50.0000 ug | INTRAMUSCULAR | Status: DC | PRN
  Administered 2018-09-09: 100 ug via INTRAVENOUS
  Administered 2018-09-10: 200 ug via INTRAVENOUS
  Filled 2018-09-08: qty 4
  Filled 2018-09-08: qty 2

## 2018-09-08 MED ORDER — MIDAZOLAM HCL (PF) 5 MG/ML IJ SOLN: 1.0000 mg | INTRAMUSCULAR | Status: DC | PRN

## 2018-09-08 MED ORDER — HYDRALAZINE HCL 20 MG/ML IJ SOLN: 10.0000 mg | INTRAMUSCULAR | Status: DC | PRN

## 2018-09-08 MED ORDER — MIDAZOLAM HCL 2 MG/2ML IJ SOLN
1.0000 mg | INTRAMUSCULAR | Status: DC | PRN
  Administered 2018-09-08 – 2018-09-09 (×2): 2 mg via INTRAVENOUS
  Filled 2018-09-08 (×2): qty 2

## 2018-09-08 NOTE — Plan of Care (Signed)
  Problem: Clinical Measurements: Goal: Ability to maintain clinical measurements within normal limits will improve Outcome: Progressing   Problem: Clinical Measurements: Goal: Diagnostic test results will improve Outcome: Progressing  Am labs WNL Problem: Clinical Measurements: Goal: Cardiovascular complication will be avoided Outcome: Progressing  Levophed weaned off Problem: Nutrition: Goal: Adequate nutrition will be maintained Outcome: Progressing  TF at goal

## 2018-09-08 NOTE — Progress Notes (Signed)
eLink Physician-Brief Progress Note Patient Name: Sheri CanterSerena L Wilden DOB: 11/15/1940 MRN: [id number]   Date of Service  09/08/2018  HPI/Events of Note  Hypertension - BP = 198/186. I question if this is correct. Nurse to recheck BP.   eICU Interventions  Will order: 1. Hydralazine 10 mg IV Q 4 hours PRN SBP > 170 or DBP > 100.      Intervention Category Major Interventions: Hypertension - evaluation and management  Noa Constante Eugene 09/08/2018, 2:21 AM

## 2018-09-08 NOTE — Progress Notes (Signed)
Rhythm has remained stable without signs of heart block. EP to sign off. No changes to meds at this time. Please call back if further questions arise.  Deshunda Thackston Elberta Fortisamnitz, MD 09/08/2018 8:49 AM

## 2018-09-08 NOTE — Progress Notes (Signed)
NAME:  Sheri Alvarez, MRN:  [id number], DOB:  1940/10/11, LOS: 4 ADMISSION DATE:  09/03/2018, CONSULTATION DATE: September 04, 2018 REFERRING MD:  Dr Silverio Lay, CHIEF COMPLAINT: Cardiac arrest  Brief History   77 year old female with history of COPD on home oxygen with witnessed PEA arrest and 16 minutes of CPR.  ROSC to complete heart block taken to Cath Lab for transvenous pacing.  History of present illness   77 year old female with past medical history as below, which is significant for COPD (sept 2019 - fev 0.6L/35%, DLCO 8/43% - gold stage 3 bordering on 4,  Class 3 dyspnea, co2 retainer - baseline bic 30) on home O2 and  Hypertension.  She is followed by Dr. Elesa Massed the pulmonary clinic who notes that on her best days she cannot walk 100 yards without having to stop due to shortness of breath.  She broke her hip about a year ago and had a replacement and since that time is been unable to lower on her own.  Her daughter had been living with her and reading these were recently high they have come back to [place].  In the morning of 12/24 the patient had no complaints and went about her morning as usual.  She went to the nail salon with her daughter where upon entering she complained of shortness of breath and very shortly after collapsed and was pulseless.  CPR was nearly immediately started by daughter and continued once fire and EMS arrived.  Initial rhythm was PEA and ACLS was done for about 60 minutes prior to ROSC.  Rhythm was then bradycardic and found to be complete heart block.  Transcutaneous pacing was initiated.  Ice packs were applied in the emergency department and cardiology consulted there.  She was taken to the cardiac catheterization lab to have a transvenous pacer placed and potentially further diagnostic work-up.  PCCM was asked to admit.  Past Medical History  COPD, HTN  Significant Hospital Events   12/24 admit, Intubated, ICU, Transvenous pacer.  12/27 - S/p rewarming. Had  ongoing respiratory acidosis overnight, vent adjusted accordingly. Repeat ABG this AM improved but still with some resp acidosis.  Vent already set at RR 30 with TV 500. Did not have purposeful movements after fentanyl weaned AM 12/27.   Consults:  Cardiology  Procedures:  Transvenous pacemaker 12/24 > ETT 12/24 > L radial a line 12/24 >   Significant Diagnostic Tests:  CT head 12/24 > no acute findings. EEG 12/24 > no seizures recorded (but did have cortical irritability which places here at higher risk for seizures). Echo 12/25 > EF 35-40%, G1DD Thyroid US 12/26 > 5.5cm right inferior thyroid nodule.  Meets criteria for elective FNA. CT head 12/27 >  Nil acute EEG 12/27 > generalized nonspecific cerebral dysfunction (encephalopathy  Micro Data:  Blood 12/24 >  Urine 12/24 >   Antimicrobials:     Interim history/subjective:   12/28 - significant resp acidosis. Bic started. EEG per neuro c/w general enceophalopathy Unable to come off fent gtt due to vent dysnchrony., Daughter POA - > patient never wanted to be "vegetable".   Objective   Blood pressure (!) 81/42, pulse (!) 101, temperature 98.4 F (36.9 C), temperature source Bladder, resp. rate 18, height 5\' 3"  (1.6 m), weight 71.6 kg, SpO2 98 %. CVP:  [8 mmHg-25 mmHg] 15 mmHg  Vent Mode: PRVC FiO2 (%):  [50 %] 50 % Set Rate:  [32 bmp] 32 bmp Vt Set:  [500 mL] 500 mL  PEEP:  [6 cmH20] 6 cmH20 Plateau Pressure:  [15 cmH20-25 cmH20] 15 cmH20   Intake/Output Summary (Last 24 hours) at 09/08/2018 0809 Last data filed at 09/08/2018 0700 Gross per 24 hour  Intake 3707.24 ml  Output 955 ml  Net 2752.24 ml   Filed Weights   09/05/18 0500 09/07/18 0500 09/08/18 0300  Weight: 72.1 kg 71.6 kg 71.6 kg   General Appearance:  Looks criticall ill. On vent Head:  Normocephalic, without obvious abnormality, atraumatic Eyes:  PERRL - yes, conjunctiva/corneas - clear     Ears:  Normal external ear canals, both ears Nose:  G tube  - no Throat:  ETT TUBE - yes , OG tube - yes Neck:  Supple,  No enlargement/tenderness/nodules Lungs: Clear to auscultation bilaterally, Ventilator   Synchrony - no. Needing lot of sedation Heart:  S1 and S2 normal, no murmur, CVP - x.  Pressors - no Abdomen:  Soft, no masses, no organomegaly Genitalia / Rectal:  Not done Extremities:  Extremities- intact. Has arctic sun pads + Skin:  ntact in exposed areas  Neurologic:  Sedation - fent gtt -> RASS - -4 . Moves all 4s - x. CAM-ICU - x . Orientation - x     LABS    PULMONARY Recent Labs  Lab 09/07/18 0347 09/07/18 0652 09/07/18 0757 09/07/18 1100 09/08/18 0409  PHART 7.153* 7.153* 7.203* 7.258* 7.184*  PCO2ART 70.2* 71.4* 64.0* 54.0* 60.8*  PO2ART 145.0* 89.0 94.0 76.0* 95.0  HCO3 24.5 25.1 25.2 24.1 22.9  TCO2 27 27 27 26 25   O2SAT 98.0 93.0 95.0 92.0 95.0    CBC Recent Labs  Lab 09/06/18 0300 09/06/18 0806 09/07/18 0342 09/08/18 0346  HGB 13.0 12.9 11.7* 10.8*  HCT 42.1 38.0 39.4 36.1  WBC 15.6*  --  14.3* 5.0  PLT 220  --  215 177    COAGULATION Recent Labs  Lab 08/26/2018 1747 09/05/18 0047  INR 1.16 1.11    CARDIAC   Recent Labs  Lab 08/18/2018 1747 09/05/18 0047 09/05/18 0421 09/05/18 0830  TROPONINI 0.21* 0.18* 0.16* 0.13*   No results for input(s): PROBNP in the last 168 hours.   CHEMISTRY Recent Labs  Lab 09/06/18 0300  09/06/18 0800 09/06/18 0806 09/06/18 1000 09/06/18 1200 09/06/18 1628 09/07/18 0342 09/07/18 1647 09/08/18 0346  NA 138   < > 139 139 138 139  --  139  --  141  K 3.7   < > 3.2* 3.2* 4.6 4.5  --  5.2*  --  5.2*  CL 105   < > 105 103 108 108  --  108  --  109  CO2 25   < > 25  --  23 23  --  22  --  22  GLUCOSE 141*   < > 129* 128* 108* 103*  --  118*  --  82  BUN 19   < > 20 20 20 20   --  22  --  28*  CREATININE 0.64   < > 0.71 0.50 0.78 0.80  --  0.70  --  0.67  CALCIUM 7.8*   < > 7.7*  --  7.8* 7.9*  --  8.0*  --  8.1*  MG 2.1  --   --   --   --   --  2.0 1.9  2.4 2.2  PHOS 5.0*  --   --   --   --   --  5.2* 4.8* 2.9 3.5   < > =  values in this interval not displayed.   Estimated Creatinine Clearance: 56.8 mL/min (by C-G formula based on SCr of 0.67 mg/dL).   LIVER Recent Labs  Lab 2018/02/26 1350 2018/02/26 1747 09/05/18 0047 09/07/18 0342  AST 91*  --   --  69*  ALT 60*  --   --  59*  ALKPHOS 67  --   --  86  BILITOT 0.8  --   --  0.5  PROT 6.3*  --   --  5.4*  ALBUMIN 3.3*  --   --  2.4*  INR  --  1.16 1.11  --      INFECTIOUS Recent Labs  Lab 2018/02/26 1402 09/07/18 0034  LATICACIDVEN 10.33* 1.3     ENDOCRINE CBG (last 3)  Recent Labs    09/08/18 0012 09/08/18 0017 09/08/18 0410  GLUCAP 36* 94 86         IMAGING x48h  - image(s) personally visualized  -   highlighted in bold Ct Head Wo Contrast  Result Date: 09/07/2018 CLINICAL DATA:  Cardiac arrest.  Anoxic brain injury. EXAM: CT HEAD WITHOUT CONTRAST TECHNIQUE: Contiguous axial images were obtained from the base of the skull through the vertex without intravenous contrast. COMPARISON:  CT head without contrast 09/05/2018 FINDINGS: Brain: No acute infarct, hemorrhage, or mass lesion is present. Basal ganglia are intact. Gray-white differentiation is maintained. No cortical swelling is evident. No significant white matter disease is present. The brainstem and cerebellum are normal. Vascular: Carotid arteries atherosclerotic calcifications are present within and at the dural the cavernous internal margin of both vertebral arteries. Skull: Calvarium is intact. No focal lytic or blastic lesions are present. Soft tissue scalp swelling is present at the right occiput. This was not present previously. There is no underlying osseous lesion. Sinuses/Orbits: Mild mucosal thickening is present in the ethmoids bilaterally. A polyp or mucous retention cyst is evident in the right maxillary sinus. Fluid extends into the posterior nasal cavity. This is likely associated with intubation.  Bilateral lens replacements are present. Globes and orbits are otherwise within normal limits. IMPRESSION: 1. Normal CT appearance of the brain. No evidence for anoxic injury. 2. Soft tissue swelling over the right occipital scalp without underlying osseous abnormality. 3. Fluid in the nasopharynx and posterior nasal cavity likely associated with intubation. Electronically Signed   By: Marin Robertshristopher  Mattern M.D.   On: 09/07/2018 10:45   Dg Chest Port 1 View  Result Date: 09/07/2018 CLINICAL DATA:  ETT EXAM: PORTABLE CHEST 1 VIEW COMPARISON:  09/06/2018 FINDINGS: Endotracheal tube terminates 2 cm above the carina. Mild bilateral lower lobe opacities, likely atelectasis. No frank interstitial edema. No pleural effusion or pneumothorax. Right IJ venous catheter terminates at the cavoatrial junction. Defibrillator pads overlying the left hemithorax. Enteric tube coursing below the diaphragm. IMPRESSION: Endotracheal tube terminates 2 cm above the carina. Additional support apparatus as above. Mild bilateral lower lobe opacities, likely atelectasis. Electronically Signed   By: Charline BillsSriyesh  Krishnan M.D.   On: 09/07/2018 06:36   [place] Thyroid  Result Date: 09/06/2018 CLINICAL DATA:  Goiter. EXAM: THYROID ULTRASOUND TECHNIQUE: Ultrasound examination of the thyroid gland and adjacent soft tissues was performed. COMPARISON:  None. FINDINGS: Parenchymal Echotexture: Mildly heterogenous Isthmus: 0.6 cm Right lobe: 7.7 x 4.2 x 3.7 cm Left lobe: 4.8 x 1.9 x 1.5 cm _________________________________________________________ Estimated total number of nodules >/= 1 cm: 1 Number of spongiform nodules >/=  2 cm not described below (TR1): 0 Number of mixed cystic and solid nodules >/=  1.5 cm not described below (TR2): 0 _________________________________________________________ Nodule # 1: Location: Right; Inferior Maximum size: 5.5 cm; Other 2 dimensions: 4.4 x 3.8 cm Composition: solid/almost completely solid (2) Echogenicity:  isoechoic (1) Shape: taller-than-wide (3) Margins: ill-defined (0) Echogenic foci: none (0) ACR TI-RADS total points: 6. ACR TI-RADS risk category: TR4 (4-6 points). ACR TI-RADS recommendations: **Given size (>/= 1.5 cm) and appearance, fine needle aspiration of this moderately suspicious nodule should be considered based on TI-RADS criteria. _________________________________________________________ Nodule # 2: Location: Left; Inferior Maximum size: 0.6 cm; Other 2 dimensions: 0.5 x 0.5 cm Composition: cystic/almost completely cystic (0) Echogenicity: anechoic (0) Shape: not taller-than-wide (0) Margins: smooth (0) Echogenic foci: none (0) ACR TI-RADS total points: 0. ACR TI-RADS risk category: TR1 (0-1 points). ACR TI-RADS recommendations: This nodule does NOT meet TI-RADS criteria for biopsy or dedicated follow-up. _________________________________________________________ No abnormal lymph nodes identified. IMPRESSION: 5.5 cm right inferior thyroid nodule meets criteria for elective fine-needle aspiration. Margins of this nodule are difficult to delineate due to thyroid goiter extending into the upper mediastinum and therefore measurements may not be entirely accurate. However, maximum diameter is clearly on the order of 5 cm. The above is in keeping with the ACR TI-RADS recommendations - J Am Coll Radiol 2017;14:587-595. Electronically Signed   By: Irish LackGlenn  Yamagata M.D.   On: 09/06/2018 16:22     Resolved Hospital Problem list     Assessment & Plan:   Cardiac arrest: Initial rhythm was PEA. 16 minutes downtime with immediate bystander CPR. GCS 3 in the immediate post code period. Etiology of unclear. Family reports SOB was rather sudden and very quickly decompensated, leading me to believe this was not a primary respiratory arrest.  She is now s/p TTM at 33 degrees, re-warm completed 12/26. eCHO 09/05/18 - ef 35%   12/28 - off pressors. EP feels rhythm normal and will sign off. Temp pacer off  PLAN -  MAP goal > 65 - EP will sign off - cards following  Chronic resp failure - gold stage 3-4 copd with baseline hypoxemia 3L [place] and hypercapnia  PLAN  - BD  Post Arrest - coma despite arctic sun   12/28 - unable to make a good assessment of mental status due to fent gttt  PLAN  - change fent gtt to diprivan gtt  - fent prn - neuro following  - keppra per neuro  Acute Resp failure due to above  12/28 - difficulty weaning due to copd and mental status  PLAN  - prvc  - bic for acidosis  Electrolyte imbalance  12/28 - high normal K.  PLAN - [place]     Best practice:  Diet: NPO Pain/Anxiety/Delirium protocol (if indicated): Fent, versed, nimbex VAP protocol (if indicated): yes DVT prophylaxis: heparin / scd's GI prophylaxis: pepcid Glucose control: SSI Mobility: BR Code Status: Full Family Communication: Goals 12/28 with daughter at bedside - will try to change fent gtt to diprivan gtt and given 24-72h to assess mentatl status. Daughter informed that even if metnal stauts recovers copd will pose a barrier to wean and likely looking at trach/ltac. She does not want mom to be a vegetable. For now No CPR. NO DEfib but full medical care. REvisit after change from fent gtt to diprivan Disposition: ICU, critically ill.     ATTESTATION & SIGNATURE   The patient Sharion SettlerSerena L Buehler is critically ill with multiple organ systems failure and requires high complexity decision making for assessment and support, frequent evaluation and titration  of therapies, application of advanced monitoring technologies and extensive interpretation of multiple databases.   Critical Care Time devoted to patient care services described in this note is  40  Minutes. This time reflects time of care of this signee Dr Kalman Shan. This critical care time does not reflect procedure time, or teaching time or supervisory time of PA/NP/Med student/Med Resident etc but could involve care discussion time       Dr. Kalman Shan, M.D., [facility].C.P Pulmonary and Critical Care Medicine Staff Physician [facility] [place] [facility] Pager: [phone], If no answer or between  15:00h - 7:00h: call [phone]  09/08/2018 8:11 AM

## 2018-09-08 NOTE — Progress Notes (Addendum)
eLink Physician-Brief Progress Note Patient Name: Sheri CanterSerena L Alvarez DOB: 08/30/1941 MRN: [id number]   Date of Service  09/08/2018  HPI/Events of Note  ABG on 50%/PRVC 32/TV 500/P 6 = 7.18/60.8/65/22.9. Not much room to increase ventilator rate or TV.   eICU Interventions  Will order: 1. NaHCO3 100 meq IV now.  2. NaHCO3 IV infusion at 50 mL/hour.  3. ABG at 7:30 AM. 4. Decrease 0.9 NaCl IV infusion to 25 mL/hour.      Intervention Category Major Interventions: Acid-Base disturbance - evaluation and management;Respiratory failure - evaluation and management  Partick Musselman Eugene 09/08/2018, 4:22 AM

## 2018-09-08 NOTE — Progress Notes (Signed)
Subjective: No changes  Exam: Vitals:   09/08/18 0915 09/08/18 1000  BP: (!) 106/59 (!) 128/107  Pulse: (!) 102 (!) 103  Resp: 20 18  Temp:    SpO2: 95% 96%   Gen: In bed, intubated Resp: ventilated  Abd: soft, nt  Neuro: MS: does not open eyes or follow commands CN: PERRL, corneals intact Motor: minimal flicker of extension x 4.  Sensory:as above.   Pertinent Labs: CO2 60.8  Impression: 77 yo F with anoxic brain injury.. I would favor giving 72 hours post-rewarming. This will be tomorrow at 8am.   Recommendations: 1) will follow.   Ritta SlotMcNeill Gizelle Whetsel, MD [facility] [phone]  If 7pm- 7am, please page neurology on call as listed in AMION.

## 2018-09-09 ENCOUNTER — Inpatient Hospital Stay (HOSPITAL_COMMUNITY): Payer: Medicare Other

## 2018-09-09 DIAGNOSIS — J9312 Secondary spontaneous pneumothorax: Secondary | ICD-10-CM

## 2018-09-09 LAB — BASIC METABOLIC PANEL
Anion gap: 10 (ref 5–15)
BUN: 27 mg/dL — ABNORMAL HIGH (ref 8–23)
CHLORIDE: 105 mmol/L (ref 98–111)
CO2: 32 mmol/L (ref 22–32)
Calcium: 8 mg/dL — ABNORMAL LOW (ref 8.9–10.3)
Creatinine, Ser: 0.7 mg/dL (ref 0.44–1.00)
GFR calc Af Amer: >60 mL/min (ref >60)
GFR calc non Af Amer: >60 mL/min (ref >60)
Glucose, Bld: 189 mg/dL — ABNORMAL HIGH (ref 70–99)
Potassium: 3.2 mmol/L — ABNORMAL LOW (ref 3.5–5.1)
Sodium: 147 mmol/L — ABNORMAL HIGH (ref 135–145)

## 2018-09-09 LAB — GLUCOSE, CAPILLARY
Glucose-Capillary: 101 mg/dL — ABNORMAL HIGH (ref 70–99)
Glucose-Capillary: 135 mg/dL — ABNORMAL HIGH (ref 70–99)
Glucose-Capillary: 136 mg/dL — ABNORMAL HIGH (ref 70–99)
Glucose-Capillary: 138 mg/dL — ABNORMAL HIGH (ref 70–99)
Glucose-Capillary: 145 mg/dL — ABNORMAL HIGH (ref 70–99)
Glucose-Capillary: 158 mg/dL — ABNORMAL HIGH (ref 70–99)
Glucose-Capillary: 186 mg/dL — ABNORMAL HIGH (ref 70–99)
Glucose-Capillary: 196 mg/dL — ABNORMAL HIGH (ref 70–99)
Glucose-Capillary: 75 mg/dL (ref 70–99)
Glucose-Capillary: 95 mg/dL (ref 70–99)

## 2018-09-09 LAB — RESPIRATORY PANEL BY PCR
Adenovirus: NOT DETECTED
Bordetella pertussis: NOT DETECTED
CORONAVIRUS OC43-RVPPCR: NOT DETECTED
Chlamydophila pneumoniae: NOT DETECTED
Coronavirus 229E: NOT DETECTED
Coronavirus HKU1: NOT DETECTED
Coronavirus NL63: NOT DETECTED
Influenza A: NOT DETECTED
Influenza B: NOT DETECTED
MYCOPLASMA PNEUMONIAE-RVPPCR: NOT DETECTED
Metapneumovirus: NOT DETECTED
Parainfluenza Virus 1: NOT DETECTED
Parainfluenza Virus 2: NOT DETECTED
Parainfluenza Virus 3: NOT DETECTED
Parainfluenza Virus 4: NOT DETECTED
Respiratory Syncytial Virus: NOT DETECTED
Rhinovirus / Enterovirus: NOT DETECTED

## 2018-09-09 LAB — CULTURE, BLOOD (ROUTINE X 2)
Culture: NO GROWTH
Culture: NO GROWTH
SPECIAL REQUESTS: ADEQUATE
Special Requests: ADEQUATE

## 2018-09-09 LAB — HEPATIC FUNCTION PANEL
ALT: 41 U/L (ref 0–44)
AST: 55 U/L — ABNORMAL HIGH (ref 15–41)
Albumin: 2 g/dL — ABNORMAL LOW (ref 3.5–5.0)
Alkaline Phosphatase: 59 U/L (ref 38–126)
Bilirubin, Direct: 0.2 mg/dL (ref 0.0–0.2)
Indirect Bilirubin: 0.1 mg/dL — ABNORMAL LOW (ref 0.3–0.9)
TOTAL PROTEIN: 5.3 g/dL — AB (ref 6.5–8.1)
Total Bilirubin: 0.3 mg/dL (ref 0.3–1.2)

## 2018-09-09 LAB — CBC
HEMATOCRIT: 31.7 % — AB (ref 36.0–46.0)
Hemoglobin: 9.7 g/dL — ABNORMAL LOW (ref 12.0–15.0)
MCH: 27.3 pg (ref 26.0–34.0)
MCHC: 30.6 g/dL (ref 30.0–36.0)
MCV: 89.3 fL (ref 80.0–100.0)
Platelets: 190 10*3/uL (ref 150–400)
RBC: 3.55 MIL/uL — ABNORMAL LOW (ref 3.87–5.11)
RDW: 13.9 % (ref 11.5–15.5)
WBC: 5.5 10*3/uL (ref 4.0–10.5)
nRBC: 0 % (ref 0.0–0.2)

## 2018-09-09 LAB — STREP PNEUMONIAE URINARY ANTIGEN: Strep Pneumo Urinary Antigen: NEGATIVE

## 2018-09-09 LAB — PROCALCITONIN: Procalcitonin: 0.81 ng/mL

## 2018-09-09 LAB — MAGNESIUM: Magnesium: 1.7 mg/dL (ref 1.7–2.4)

## 2018-09-09 LAB — LACTIC ACID, PLASMA: LACTIC ACID, VENOUS: 1.9 mmol/L (ref 0.5–1.9)

## 2018-09-09 LAB — PHOSPHORUS: Phosphorus: 1.1 mg/dL — ABNORMAL LOW (ref 2.5–4.6)

## 2018-09-09 MED ORDER — SODIUM CHLORIDE 0.9 % IV BOLUS
1000.0000 mL | Freq: Once | INTRAVENOUS | Status: AC
  Administered 2018-09-09: 1000 mL via INTRAVENOUS

## 2018-09-09 MED ORDER — SODIUM CHLORIDE 0.9 % IV SOLN
1.0000 g | Freq: Three times a day (TID) | INTRAVENOUS | Status: DC
  Administered 2018-09-09 – 2018-09-11 (×6): 1 g via INTRAVENOUS
  Filled 2018-09-09 (×8): qty 1

## 2018-09-09 MED ORDER — ROCURONIUM BROMIDE 50 MG/5ML IV SOLN
50.0000 mg | Freq: Once | INTRAVENOUS | Status: DC
  Filled 2018-09-09: qty 5

## 2018-09-09 MED ORDER — VANCOMYCIN HCL 10 G IV SOLR
1500.0000 mg | Freq: Once | INTRAVENOUS | Status: AC
  Administered 2018-09-09: 1500 mg via INTRAVENOUS
  Filled 2018-09-09: qty 1500

## 2018-09-09 MED ORDER — AZTREONAM 1 G IJ SOLR
1.0000 g | Freq: Three times a day (TID) | INTRAMUSCULAR | Status: DC
  Filled 2018-09-09 (×2): qty 1

## 2018-09-09 MED ORDER — ATROPINE SULFATE 1 MG/10ML IJ SOSY
PREFILLED_SYRINGE | INTRAMUSCULAR | Status: AC
  Filled 2018-09-09: qty 10

## 2018-09-09 MED ORDER — SODIUM BICARBONATE 8.4 % IV SOLN
50.0000 meq | Freq: Once | INTRAVENOUS | Status: AC
  Administered 2018-09-09: 50 meq via INTRAVENOUS

## 2018-09-09 MED ORDER — SODIUM BICARBONATE 8.4 % IV SOLN
INTRAVENOUS | Status: AC
  Administered 2018-09-09: 50 meq via INTRAVENOUS
  Filled 2018-09-09: qty 50

## 2018-09-09 MED ORDER — VANCOMYCIN HCL IN DEXTROSE 750-5 MG/150ML-% IV SOLN
750.0000 mg | INTRAVENOUS | Status: DC
  Administered 2018-09-10: 750 mg via INTRAVENOUS
  Filled 2018-09-09 (×2): qty 150

## 2018-09-09 MED ORDER — ETOMIDATE 2 MG/ML IV SOLN
20.0000 mg | Freq: Once | INTRAVENOUS | Status: AC
  Administered 2018-09-09: 10 mg via INTRAVENOUS

## 2018-09-09 MED ORDER — POTASSIUM PHOSPHATES 15 MMOLE/5ML IV SOLN
30.0000 mmol | Freq: Once | INTRAVENOUS | Status: AC
  Administered 2018-09-09: 30 mmol via INTRAVENOUS
  Filled 2018-09-09: qty 10

## 2018-09-09 MED ORDER — POTASSIUM CHLORIDE 20 MEQ/15ML (10%) PO SOLN
40.0000 meq | Freq: Once | ORAL | Status: AC
  Administered 2018-09-09: 40 meq
  Filled 2018-09-09: qty 30

## 2018-09-09 MED ORDER — MAGNESIUM SULFATE 4 GM/100ML IV SOLN
4.0000 g | Freq: Once | INTRAVENOUS | Status: AC
  Administered 2018-09-09: 4 g via INTRAVENOUS
  Filled 2018-09-09: qty 100

## 2018-09-09 NOTE — Progress Notes (Signed)
R Femoral TVP removed per MD order. Manual pressure held until hemostasis achieved. R Femoral site level 0, pedal +1 bilateral, patient tolerated well, VS stable. Sheri Alvarez

## 2018-09-09 NOTE — Procedures (Signed)
Chest Tube Insertion Procedure Note  Indications:  Clinically significant Pneumothorax  Pre-operative Diagnosis: Pneumothorax  Post-operative Diagnosis: Pneumothorax  Procedure Details  Informed consent was obtained for the procedure, including sedation.  Risks of lung perforation, hemorrhage, arrhythmia, and adverse drug reaction were discussed.   After sterile skin prep, using standard technique, a 28 French tube was placed in the right anterior 5th rib space.  Findings: None  Estimated Blood Loss:  0         Specimens:  None              Complications:  None; patient tolerated the procedure well.         Disposition: ICU - intubated and critically ill.         Condition: stable  Attending Attestation: I performed the procedure.  Noted: Chest tube left at 10cm at skin. On CXR - edge is in. RN instructed to ensure chest tube does NOT get dislodged. Will do followup cxr at 7pm and again 09/10/18 AM    SIGNATURE    Dr. Kalman ShanMurali Kalei Mckillop, M.D., F.C.C.P,  Pulmonary and Critical Care Medicine Staff Physician, [facility] Center Director - Interstitial Lung Disease  Program  Pulmonary Fibrosis [facility] [place], [place], [id number]  Pager: [phone], If no answer or between  15:00h - 7:00h: call [phone] Telephone: [phone]  3:39 PM 09/09/2018

## 2018-09-09 NOTE — Progress Notes (Signed)
Per RN will call when patient is more stable and can come down.Marland Kitchen.Marland Kitchen..Marland Kitchen

## 2018-09-09 NOTE — Progress Notes (Signed)
Pharmacy Antibiotic Note  Sheri Alvarez is a 77 y.o. female admitted on 09/10/2018 with concerns for sepsis. Tmax 102.2, WBC 5.5, PCT pending. Renal function stable.  Patient with a history of cephalosporin allergy, outside documentation includes both cefazolin and cephalexin, most severe reaction syncope/shortness of breath. After discussion with Dr. Marchelle Gearingamaswamy, will not challenge. Pharmacy has been consulted for vancomycin/aztreonam dosing.   CrCl (AdjB)= 59 mL/min  Plan: Vancomycin 1500 mg IV x1, then 750 mg IV q24, anticipated AUC 457 Aztreonam 1g g IV q8h   Height: 5\' 3"  (160 cm) Weight: 168 lb 3.4 oz (76.3 kg) IBW/kg (Calculated) : 52.4  Temp (24hrs), Avg:100.9 F (38.3 C), Min:98.6 F (37 C), Max:102.2 F (39 C)  Recent Labs  Lab 09/01/2018 1402  09/05/18 0421  09/06/18 0300  09/06/18 1000 09/06/18 1200 09/07/18 0034 09/07/18 0342 09/08/18 0346 09/09/18 0414  WBC  --    < > 18.3*  --  15.6*  --   --   --   --  14.3* 5.0 5.5  CREATININE  --    < > 0.59   < > 0.64   < > 0.78 0.80  --  0.70 0.67 0.70  LATICACIDVEN 10.33*  --   --   --   --   --   --   --  1.3  --   --   --    < > = values in this interval not displayed.    Estimated Creatinine Clearance: 58.6 mL/min (by C-G formula based on SCr of 0.7 mg/dL).    Allergies  Allergen Reactions  . Cephalexin Anaphylaxis    syncope    Antimicrobials this admission: Vanc 12/29 >> Aztreonam 12/29 >>  Dose adjustments this admission:   Microbiology results: 12/29 Ucx: * 12/29 TA: * 12/29 BCx: * 12/24 BCx: NGx4 days 12/24 UCx: NGTD 12/24 mrsa: negative   Thank you for allowing pharmacy to be a part of this patient's care.   Marcelino FreestoneEmily McElhaney, PharmD PGY2 Cardiology Pharmacy Resident Phone [phone] Please check AMION for all Pharmacist numbers by unit 09/09/2018 10:13 AM

## 2018-09-09 NOTE — Progress Notes (Signed)
This note also relates to the following rows which could not be included: SpO2 - Cannot attach notes to unvalidated device data  RT NOTE: RTT advanced 1cm to 25 at the lips per order. Vitals are stable. RT will continue to monitor.

## 2018-09-09 NOTE — Progress Notes (Signed)
Subjective: No significant changes  Exam: Vitals:   09/09/18 1550 09/09/18 1600  BP:  (!) 153/67  Pulse:  95  Resp:  (!) 25  Temp:  99 F (37.2 C)  SpO2: 95% 94%   Gen: In bed, intubated Resp: ventilated  Abd: soft, nt  Neuro: MS: does not open eyes or follow commands CN: PERRL, corneals intact Motor: She has flexion to noxious stimulation of bilateral legs in the left arm, no movement to the right arm Sensory:as above.    Impression: 77 yo F with anoxic brain injury.  Currently she has intact brainstem and flexion response at 72 hours, though I remain very concerned about her prognosis, this is an indeterminate exam.  I think we should get an MRI and if this does demonstrate significant anoxic injury, then I think this could be helpful in prognostication.  Recommendations: 1) MRI brain 2) we will follow  Sheri SlotMcNeill Keifer Habib, MD [facility] [phone]  If 7pm- 7am, please page neurology on call as listed in AMION.

## 2018-09-09 NOTE — Progress Notes (Signed)
NAME:  Sheri Alvarez, MRN:  [id number], DOB:  1941-02-23, LOS: 5 ADMISSION DATE:  Sep 16, 2018, CONSULTATION DATE: Sep 16, 2018 REFERRING MD:  Dr Silverio Lay, CHIEF COMPLAINT: Cardiac arrest  Brief History   77 year old female with past medical history as below, which is significant for COPD (sept 2019 - fev 0.6L/35%, DLCO 8/43% - gold stage 3 bordering on 4,  Class 3 dyspnea, co2 retainer - baseline bic 30) on home O2 and  Hypertension.  She is followed by Dr. Elesa Massed the pulmonary clinic who notes that on her best days she cannot walk 100 yards without having to stop due to shortness of breath.  She broke her hip about a year ago and had a replacement and since that time is been unable to lower on her own.  Her daughter had been living with her and reading these were recently high they have come back to [place].  In the morning of 12/24 the patient had no complaints and went about her morning as usual.  She went to the nail salon with her daughter where upon entering she complained of shortness of breath and very shortly after collapsed and was pulseless.  CPR was nearly immediately started by daughter and continued once fire and EMS arrived.  Initial rhythm was PEA and ACLS was done for about 60 minutes prior to ROSC.  Rhythm was then bradycardic and found to be complete heart block.  Transcutaneous pacing was initiated.  Ice packs were applied in the emergency department and cardiology consulted there.  She was taken to the cardiac catheterization lab to have a transvenous pacer placed and potentially further diagnostic work-up.  PCCM was asked to admit.  Past Medical History  COPD, HTN  Significant Hospital Events   12/24 admit, Intubated, ICU, Transvenous pacer.  12/27 - S/p rewarming. Had ongoing respiratory acidosis overnight, vent adjusted accordingly. Repeat ABG this AM improved but still with some resp acidosis.  Vent already set at RR 30 with TV 500. Did not have purposeful movements  after fentanyl weaned AM 12/27.  12/28 - significant resp acidosis. Bic started. EEG per neuro c/w general enceophalopathy Unable to come off fent gtt due to vent dysnchrony., Daughter POA - > patient never wanted to be "vegetable".   Consults:  Cardiology  Procedures:  Transvenous pacemaker 12/24 > ETT 12/24 > L radial a line 12/24 >   Significant Diagnostic Tests:  CT head 12/24 > no acute findings. EEG 12/24 > no seizures recorded (but did have cortical irritability which places here at higher risk for seizures). Echo 12/25 > EF 35-40%, G1DD Thyroid US 12/26 > 5.5cm right inferior thyroid nodule.  Meets criteria for elective FNA. CT head 12/27 >  Nil acute EEG 12/27 > generalized nonspecific cerebral dysfunction (encephalopathy  Micro Data:  Blood 12/24 > neg as of 12/29 Urine 12/24 > neg as of 12./29 ...... Blood 12/29 Urine 12/29 Track 12/29 RVP 12/29 Urine leg 12/29 Urine strep 12/29   Antimicrobials:   vanc 12/29  cefepine 12/29  Interim history/subjective:   12/29 - OFf fent gtt x 24h. On diprivan gtt . Still comatose. Fio2 50% on vent. Maintains BP. Has new Rt basal pneumothorax. Bic gtt continues  Objective   Blood pressure (!) 134/55, pulse (!) 101, temperature (!) 100.9 F (38.3 C), resp. rate (!) 24, height 5\' 3"  (1.6 m), weight 76.3 kg, SpO2 93 %. CVP:  [13 mmHg-16 mmHg] 13 mmHg  Vent Mode: PRVC FiO2 (%):  [0.7 %-50 %] 50 %  Set Rate:  [22 bmp] 22 bmp Vt Set:  [500 mL] 500 mL PEEP:  [6 cmH20] 6 cmH20 Plateau Pressure:  [17 cmH20-24 cmH20] 21 cmH20   Intake/Output Summary (Last 24 hours) at 09/09/2018 0857 Last data filed at 09/09/2018 0600 Gross per 24 hour  Intake 3876.59 ml  Output 1200 ml  Net 2676.59 ml   Filed Weights   09/07/18 0500 09/08/18 0300 09/09/18 0500  Weight: 71.6 kg 71.6 kg 76.3 kg     General Appearance:  Looks criticall ill OBESE - yes Head:  Normocephalic, without obvious abnormality, atraumatic Eyes:  PERRL - yes,  conjunctiva/corneas - clear     Ears:  Normal external ear canals, both ears Nose:  G tube - no Throat:  ETT TUBE - yes , OG tube - yes Neck:  Supple,  No enlargement/tenderness/nodules Lungs: Clear to auscultation bilaterally, Ventilator   Synchrony - no, due to copd Heart:  S1 and S2 normal, no murmur, CVP - no.  Pressors - no Abdomen:  Soft, no masses, no organomegaly Genitalia / Rectal:  Not done Extremities:  Extremities- intact, EDEMA + Skin:  ntact in exposed areas . Sacral area - not examined Neurologic:  Sedation - diprivan gtt -> RASS - -4       LABS    PULMONARY Recent Labs  Lab 09/07/18 0347 09/07/18 0652 09/07/18 0757 09/07/18 1100 09/08/18 0409  PHART 7.153* 7.153* 7.203* 7.258* 7.184*  PCO2ART 70.2* 71.4* 64.0* 54.0* 60.8*  PO2ART 145.0* 89.0 94.0 76.0* 95.0  HCO3 24.5 25.1 25.2 24.1 22.9  TCO2 27 27 27 26 25   O2SAT 98.0 93.0 95.0 92.0 95.0    CBC Recent Labs  Lab 09/07/18 0342 09/08/18 0346 09/09/18 0414  HGB 11.7* 10.8* 9.7*  HCT 39.4 36.1 31.7*  WBC 14.3* 5.0 5.5  PLT 215 177 190    COAGULATION Recent Labs  Lab 09/01/2018 1747 09/05/18 0047  INR 1.16 1.11    CARDIAC   Recent Labs  Lab 09/03/2018 1747 09/05/18 0047 09/05/18 0421 09/05/18 0830  TROPONINI 0.21* 0.18* 0.16* 0.13*   No results for input(s): PROBNP in the last 168 hours.   CHEMISTRY Recent Labs  Lab 09/06/18 1000 09/06/18 1200 09/06/18 1628 09/07/18 0342 09/07/18 1647 09/08/18 0346 09/09/18 0414  NA 138 139  --  139  --  141 147*  K 4.6 4.5  --  5.2*  --  5.2* 3.2*  CL 108 108  --  108  --  109 105  CO2 23 23  --  22  --  22 32  GLUCOSE 108* 103*  --  118*  --  82 189*  BUN 20 20  --  22  --  28* 27*  CREATININE 0.78 0.80  --  0.70  --  0.67 0.70  CALCIUM 7.8* 7.9*  --  8.0*  --  8.1* 8.0*  MG  --   --  2.0 1.9 2.4 2.2 1.7  PHOS  --   --  5.2* 4.8* 2.9 3.5 1.1*   Estimated Creatinine Clearance: 58.6 mL/min (by C-G formula based on SCr of 0.7  mg/dL).   LIVER Recent Labs  Lab 09/09/2018 1350 08/19/2018 1747 09/05/18 0047 09/07/18 0342 09/09/18 0414  AST 91*  --   --  69* 55*  ALT 60*  --   --  59* 41  ALKPHOS 67  --   --  86 59  BILITOT 0.8  --   --  0.5 0.3  PROT 6.3*  --   --  5.4* 5.3*  ALBUMIN 3.3*  --   --  2.4* 2.0*  INR  --  1.16 1.11  --   --      INFECTIOUS Recent Labs  Lab 08/17/2018 1402 09/07/18 0034  LATICACIDVEN 10.33* 1.3     ENDOCRINE CBG (last 3)  Recent Labs    09/08/18 2356 09/09/18 0304 09/09/18 0809  GLUCAP 162* 158* 186*         IMAGING x48h  - image(s) personally visualized  -   highlighted in bold Ct Head Wo Contrast  Result Date: 09/07/2018 CLINICAL DATA:  Cardiac arrest.  Anoxic brain injury. EXAM: CT HEAD WITHOUT CONTRAST TECHNIQUE: Contiguous axial images were obtained from the base of the skull through the vertex without intravenous contrast. COMPARISON:  CT head without contrast 09/05/2018 FINDINGS: Brain: No acute infarct, hemorrhage, or mass lesion is present. Basal ganglia are intact. Gray-white differentiation is maintained. No cortical swelling is evident. No significant white matter disease is present. The brainstem and cerebellum are normal. Vascular: Carotid arteries atherosclerotic calcifications are present within and at the dural the cavernous internal margin of both vertebral arteries. Skull: Calvarium is intact. No focal lytic or blastic lesions are present. Soft tissue scalp swelling is present at the right occiput. This was not present previously. There is no underlying osseous lesion. Sinuses/Orbits: Mild mucosal thickening is present in the ethmoids bilaterally. A polyp or mucous retention cyst is evident in the right maxillary sinus. Fluid extends into the posterior nasal cavity. This is likely associated with intubation. Bilateral lens replacements are present. Globes and orbits are otherwise within normal limits. IMPRESSION: 1. Normal CT appearance of the brain.  No evidence for anoxic injury. 2. Soft tissue swelling over the right occipital scalp without underlying osseous abnormality. 3. Fluid in the nasopharynx and posterior nasal cavity likely associated with intubation. Electronically Signed   By: Marin Robertshristopher  Mattern M.D.   On: 09/07/2018 10:45   Dg Chest Port 1 View  Result Date: 09/09/2018 CLINICAL DATA:  77 year old female with history of intubation. EXAM: PORTABLE CHEST 1 VIEW COMPARISON:  Chest x-ray 08/31/2018. FINDINGS: An endotracheal tube is in place with tip 5.0 cm above the carina. There is a right-sided internal jugular central venous catheter with tip terminating in the distal superior vena cava. A nasogastric tube is seen extending into the stomach, however, the tip of the nasogastric tube extends below the lower margin of the image. New pneumothorax in the base of the right hemithorax, moderate in size occupying at least 20% of the volume of the right hemithorax. No left-sided pneumothorax. Patchy multifocal interstitial opacities are noted throughout the lungs bilaterally, similar to the prior examination. Diffuse peribronchial cuffing. Emphysematous changes. No evidence of pulmonary edema. Heart size is normal. Upper mediastinal contours are distorted by patient's rotation the left. IMPRESSION: 1. Support apparatus, as above. 2. New right-sided pneumothorax at least moderate in size, located predominantly in the base of the right hemithorax. 3. Diffuse interstitial prominence again noted throughout the lungs bilaterally, similar to recent prior examinations. 4. Emphysema Critical Value/emergent results were called by telephone at the time of interpretation on 09/09/2018 at 8:43 am to [facility] for Dr. Kalman ShanMURALI Maryclare Nydam, who verbally acknowledged these results. Electronically Signed   By: Trudie Reedaniel  Entrikin M.D.   On: 09/09/2018 08:46   Dg Chest Port 1 View  Result Date: 09/08/2018 CLINICAL DATA:  Respiratory failure EXAM: PORTABLE CHEST 1  VIEW COMPARISON:  Yesterday FINDINGS: Endotracheal tube tip between the clavicular heads and carina.  An orogastric tube at least reaches the stomach. Right IJ line with tip at the SVC. Large lung volumes with diffuse interstitial coarsening. Small pleural effusions, greater on the left. There is emphysematous changes the apices. Trachea is deviated to the left by a thyroid nodule based on cervical spine CT 09/05/2018 IMPRESSION: 1. Emphysema and small pleural effusions. 2. Stable hardware positioning. Electronically Signed   By: Marnee Spring M.D.   On: 09/08/2018 08:25     Resolved Hospital Problem list     Assessment & Plan:   Cardiac arrest: Initial rhythm was PEA. 16 minutes downtime with immediate bystander CPR. GCS 3 in the immediate post code period. Etiology of unclear. Family reports SOB was rather sudden and very quickly decompensated, leading me to believe this was not a primary respiratory arrest.  She is now s/p TTM at 33 degrees, re-warm completed 12/26. eCHO 09/05/18 - ef 35%  12/29 - remains off pressors. EP signed off  PLAN - MAP goal > 65 - cards following   Post Arrest - coma despite arctic sun   12/29  - coma off fent gtt x 24h continues  PLAN  -dipriivan gtt  - fent prn - neuro following  - keppra per neuro - RASS goal 0 to -2 (But she is at -4)    Chronic resp failure - gold stage 3-4 copd with baseline hypoxemia 3L Daniel and hypercapnia  PLAN  - BD  Acute Resp failure due to above   09/09/2018 - > does not meet criteria for SBT/Extubation in setting of Acute Respiratory Failure due to copd and compa   PLAN  - PRVC to continue - lower RR 20  - bic for resp acidosis   Electrolyte imbalance  12/29 - Hypokalemia Hypomagnesemia Hypophosphatemia   PLAN - replete all  Febrile 09/09/18. Culture negative at admit 08/18/2018 - rule out sepsis - check RVP, urine leg, urine strep, PCT, lactate, and pan culture  - empiric vanc, cefepime - cooling  blanket  Rt Pneumothorax 12/29./19 - new  - will place chest tube   Best practice:  Diet: NPO Pain/Anxiety/Delirium protocol (if indicated): Fent, versed, nimbex VAP protocol (if indicated): yes DVT prophylaxis: heparin / scd's GI prophylaxis: pepcid Glucose control: SSI Mobility: BR Code Status: Full Family Communication:  -  Goals 12/28 with daughter at bedside - will try to change fent gtt to diprivan gtt and given 24-72h to assess mentatl status. Daughter informed that even if metnal stauts recovers copd will pose a barrier to wean and likely looking at trach/ltac. She does not want mom to be a vegetable. For now No CPR. NO DEfib but full medical care. REvisit after change from fent gtt to diprivan  - 12/29 - daughter updated  Disposition: ICU, critically ill.     ATTESTATION & SIGNATURE   The patient Cortni Tays Bledsoe is critically ill with multiple organ systems failure and requires high complexity decision making for assessment and support, frequent evaluation and titration of therapies, application of advanced monitoring technologies and extensive interpretation of multiple databases.   Critical Care Time devoted to patient care services described in this note is  40  Minutes. This time reflects time of care of this signee Dr Kalman Shan. This critical care time does not reflect procedure time, or teaching time or supervisory time of PA/NP/Med student/Med Resident etc but could involve care discussion time     Dr. Kalman Shan, M.D., [facility].C.P Pulmonary and Critical Care Medicine Staff Physician [facility]  System [place] [facility] and Critical Care Pager: [phone], If no answer or between  15:00h - 7:00h: call [phone]  09/09/2018 9:23 AM

## 2018-09-09 NOTE — Progress Notes (Signed)
This note also relates to the following rows which could not be included: SpO2 - Cannot attach notes to unvalidated device data  RT NOTE: ETT advanced 2cm to 27 at the lips per MD order. Vitals are stable. RT will continue to monitor.  

## 2018-09-10 ENCOUNTER — Inpatient Hospital Stay (HOSPITAL_COMMUNITY): Payer: Medicare Other

## 2018-09-10 DIAGNOSIS — E876 Hypokalemia: Secondary | ICD-10-CM

## 2018-09-10 DIAGNOSIS — J9622 Acute and chronic respiratory failure with hypercapnia: Secondary | ICD-10-CM

## 2018-09-10 DIAGNOSIS — R4189 Other symptoms and signs involving cognitive functions and awareness: Secondary | ICD-10-CM

## 2018-09-10 LAB — GLUCOSE, CAPILLARY
GLUCOSE-CAPILLARY: 149 mg/dL — AB (ref 70–99)
GLUCOSE-CAPILLARY: 211 mg/dL — AB (ref 70–99)
Glucose-Capillary: 136 mg/dL — ABNORMAL HIGH (ref 70–99)
Glucose-Capillary: 36 mg/dL — CL (ref 70–99)
Glucose-Capillary: 87 mg/dL (ref 70–99)
Glucose-Capillary: 91 mg/dL (ref 70–99)

## 2018-09-10 LAB — BASIC METABOLIC PANEL
Anion gap: 8 (ref 5–15)
Anion gap: 10 (ref 5–15)
BUN: 18 mg/dL (ref 8–23)
BUN: 17 mg/dL (ref 8–23)
CHLORIDE: 102 mmol/L (ref 98–111)
CO2: 33 mmol/L — AB (ref 22–32)
CO2: 33 mmol/L — ABNORMAL HIGH (ref 22–32)
Calcium: 7.6 mg/dL — ABNORMAL LOW (ref 8.9–10.3)
Calcium: 7.9 mg/dL — ABNORMAL LOW (ref 8.9–10.3)
Chloride: 105 mmol/L (ref 98–111)
Creatinine, Ser: 0.56 mg/dL (ref 0.44–1.00)
Creatinine, Ser: 0.51 mg/dL (ref 0.44–1.00)
GFR calc Af Amer: >60 mL/min (ref >60)
GFR calc non Af Amer: >60 mL/min (ref >60)
GFR calc non Af Amer: >60 mL/min (ref >60)
Glucose, Bld: 143 mg/dL — ABNORMAL HIGH (ref 70–99)
Glucose, Bld: 101 mg/dL — ABNORMAL HIGH (ref 70–99)
Potassium: 3.3 mmol/L — ABNORMAL LOW (ref 3.5–5.1)
Potassium: 4.3 mmol/L (ref 3.5–5.1)
SODIUM: 146 mmol/L — AB (ref 135–145)
Sodium: 145 mmol/L (ref 135–145)

## 2018-09-10 LAB — PHOSPHORUS
Phosphorus: 1.6 mg/dL — ABNORMAL LOW (ref 2.5–4.6)
Phosphorus: 2.8 mg/dL (ref 2.5–4.6)

## 2018-09-10 LAB — LEGIONELLA PNEUMOPHILA SEROGP 1 UR AG: L. pneumophila Serogp 1 Ur Ag: NEGATIVE

## 2018-09-10 LAB — POCT I-STAT 3, ART BLOOD GAS (G3+)
Acid-Base Excess: 8 mmol/L — ABNORMAL HIGH (ref 0.0–2.0)
Bicarbonate: 33.6 mmol/L — ABNORMAL HIGH (ref 20.0–28.0)
O2 Saturation: 83 %
Patient temperature: 98.6
TCO2: 35 mmol/L — ABNORMAL HIGH (ref 22–32)
pCO2 arterial: 48.5 mmHg — ABNORMAL HIGH (ref 32.0–48.0)
pH, Arterial: 7.448 (ref 7.350–7.450)
pO2, Arterial: 47 mmHg — ABNORMAL LOW (ref 83.0–108.0)

## 2018-09-10 LAB — CBC
HCT: 29.4 % — ABNORMAL LOW (ref 36.0–46.0)
HEMOGLOBIN: 9 g/dL — AB (ref 12.0–15.0)
MCH: 27.2 pg (ref 26.0–34.0)
MCHC: 30.6 g/dL (ref 30.0–36.0)
MCV: 88.8 fL (ref 80.0–100.0)
Platelets: 143 10*3/uL — ABNORMAL LOW (ref 150–400)
RBC: 3.31 MIL/uL — ABNORMAL LOW (ref 3.87–5.11)
RDW: 14.4 % (ref 11.5–15.5)
WBC: 6.2 10*3/uL (ref 4.0–10.5)
nRBC: 0 % (ref 0.0–0.2)

## 2018-09-10 LAB — MAGNESIUM: Magnesium: 1.9 mg/dL (ref 1.7–2.4)

## 2018-09-10 LAB — PROCALCITONIN: Procalcitonin: 0.53 ng/mL

## 2018-09-10 MED ORDER — FUROSEMIDE 10 MG/ML IJ SOLN
40.0000 mg | Freq: Once | INTRAMUSCULAR | Status: AC
  Administered 2018-09-10: 40 mg via INTRAVENOUS
  Filled 2018-09-10: qty 4

## 2018-09-10 MED ORDER — POTASSIUM CHLORIDE 20 MEQ/15ML (10%) PO SOLN
40.0000 meq | Freq: Once | ORAL | Status: AC
  Administered 2018-09-10: 40 meq via ORAL
  Filled 2018-09-10: qty 30

## 2018-09-10 MED ORDER — MAGNESIUM SULFATE IN D5W 1-5 GM/100ML-% IV SOLN
1.0000 g | Freq: Once | INTRAVENOUS | Status: AC
  Administered 2018-09-10: 1 g via INTRAVENOUS
  Filled 2018-09-10: qty 100

## 2018-09-10 MED ORDER — CALCIUM GLUCONATE-NACL 2-0.675 GM/100ML-% IV SOLN
2.0000 g | Freq: Once | INTRAVENOUS | Status: AC
  Administered 2018-09-10: 2000 mg via INTRAVENOUS
  Filled 2018-09-10: qty 100

## 2018-09-10 MED ORDER — POTASSIUM PHOSPHATES 15 MMOLE/5ML IV SOLN
20.0000 mmol | Freq: Once | INTRAVENOUS | Status: AC
  Administered 2018-09-10: 20 mmol via INTRAVENOUS
  Filled 2018-09-10: qty 6.67

## 2018-09-10 MED ORDER — POTASSIUM CHLORIDE 20 MEQ PO PACK
40.0000 meq | PACK | Freq: Once | ORAL | Status: AC
  Administered 2018-09-10: 40 meq via ORAL
  Filled 2018-09-10: qty 2

## 2018-09-10 NOTE — Progress Notes (Signed)
NAME:  Sheri Alvarez, MRN:  [id number], DOB:  September 04, 1941, LOS: 6 ADMISSION DATE:  09/09/2018, CONSULTATION DATE: September 09, 2018 REFERRING MD:  Dr Silverio Lay, CHIEF COMPLAINT: Cardiac arrest  Brief History   77 year old female with past medical history as below, which is significant for COPD (sept 2019 - fev 0.6L/35%, DLCO 8/43% - gold stage 3 bordering on 4,  Class 3 dyspnea, co2 retainer - baseline bic 30) on home O2 and  Hypertension.  She is followed by Dr. Elesa Massed the pulmonary clinic who notes that on her best days she cannot walk 100 yards without having to stop due to shortness of breath.  She broke her hip about a year ago and had a replacement and since that time is been unable to lower on her own.  Her daughter had been living with her and reading these were recently high they have come back to [place].  In the morning of 12/24 the patient had no complaints and went about her morning as usual.  She went to the nail salon with her daughter where upon entering she complained of shortness of breath and very shortly after collapsed and was pulseless.  CPR was nearly immediately started by daughter and continued once fire and EMS arrived.  Initial rhythm was PEA and ACLS was done for about 60 minutes prior to ROSC.  Rhythm was then bradycardic and found to be complete heart block.  Transcutaneous pacing was initiated.  Ice packs were applied in the emergency department and cardiology consulted there.  She was taken to the cardiac catheterization lab to have a transvenous pacer placed and potentially further diagnostic work-up.  PCCM was asked to admit.  Past Medical History  COPD, HTN  Significant Hospital Events   12/24 admit, Intubated, ICU, Transvenous pacer.  12/27 - S/p rewarming. Had ongoing respiratory acidosis overnight, vent adjusted accordingly. Repeat ABG this AM improved but still with some resp acidosis.  Vent already set at RR 30 with TV 500. Did not have purposeful movements  after fentanyl weaned AM 12/27.  12/28 - significant resp acidosis. Bic started. EEG per neuro c/w general enceophalopathy Unable to come off fent gtt due to vent dysnchrony., Daughter POA - > patient never wanted to be "vegetable".   Consults:  Cardiology 12/24-12/28  Procedures:  Transvenous pacemaker 12/24 > ETT 12/24 > L radial a line 12/24 >   Significant Diagnostic Tests:  CT head 12/24 > no acute findings. EEG 12/24 > no seizures recorded (but did have cortical irritability which places here at higher risk for seizures). Echo 12/25 > EF 35-40% with diffuse hypokinesis, G1DD, moderate RV dilatation with normal systolic function Thyroid US 12/26 > 5.5cm right inferior thyroid nodule.  Meets criteria for elective FNA. CT head 12/27 >  Nil acute EEG 12/27 > generalized nonspecific cerebral dysfunction (encephalopathy  Micro Data:  Blood 12/24 > neg as of 12/29 Urine 12/24 > neg as of 12./29 ...... Blood 12/29 Urine 12/29 Track 12/29 RVP 12/29 Urine leg 12/29 Urine strep 12/29   Antimicrobials:  Vanc 12/29>> Aztreonam 12/29>>  Interim history/subjective:  Off sedation with no spontanous movement. Daughter at bedside. Updated family on current medical management. I expressed concern for poor neurological recovery after being off sedation however will readdress after MRI is completed.  Objective   Blood pressure (!) 117/45, pulse 93, temperature 100 F (37.8 C), resp. rate (!) 26, height 5\' 3"  (1.6 m), weight 80.2 kg, SpO2 93 %. CVP:  [9 mmHg-12 mmHg] 9  mmHg  Vent Mode: PRVC FiO2 (%):  [50 %-100 %] 60 % Set Rate:  [22 bmp] 22 bmp Vt Set:  [500 mL] 500 mL PEEP:  [6 cmH20] 6 cmH20 Plateau Pressure:  [18 cmH20-24 cmH20] 18 cmH20   Intake/Output Summary (Last 24 hours) at 09/10/2018 1050 Last data filed at 09/10/2018 1000 Gross per 24 hour  Intake 4569.08 ml  Output 1440 ml  Net 3129.08 ml   Filed Weights   09/08/18 0300 09/09/18 0500 09/10/18 0246  Weight: 71.6  kg 76.3 kg 80.2 kg    Physical Exam: General: Unresponsive, intubated, off sedation HENT: [place], AT, ETT in place Eyes: Downward deviation bilaterally, absent oculocephalic reflex Respiratory: Decreased breath sounds bilaterally, vented breath sounds Cardiovascular: irregularly irregular, -M/R/G GI: BS+, soft, nontender Extremities:2+ pitting edema in extremities x 4 Neuro: Unresponsive, no withdrawal to pain, no spontaneous movement Skin: Edematous GU: Foley in place  LABS    PULMONARY Recent Labs  Lab 09/07/18 0347 09/07/18 0652 09/07/18 0757 09/07/18 1100 09/08/18 0409  PHART 7.153* 7.153* 7.203* 7.258* 7.184*  PCO2ART 70.2* 71.4* 64.0* 54.0* 60.8*  PO2ART 145.0* 89.0 94.0 76.0* 95.0  HCO3 24.5 25.1 25.2 24.1 22.9  TCO2 27 27 27 26 25   O2SAT 98.0 93.0 95.0 92.0 95.0    CBC Recent Labs  Lab 09/08/18 0346 09/09/18 0414 09/10/18 0406  HGB 10.8* 9.7* 9.0*  HCT 36.1 31.7* 29.4*  WBC 5.0 5.5 6.2  PLT 177 190 143*    COAGULATION Recent Labs  Lab 2018/06/01 1747 09/05/18 0047  INR 1.16 1.11    CARDIAC   Recent Labs  Lab 2018/06/01 1747 09/05/18 0047 09/05/18 0421 09/05/18 0830  TROPONINI 0.21* 0.18* 0.16* 0.13*   No results for input(s): PROBNP in the last 168 hours.   CHEMISTRY Recent Labs  Lab 09/06/18 1200  09/07/18 0342 09/07/18 1647 09/08/18 0346 09/09/18 0414 09/10/18 0406  NA 139  --  139  --  141 147* 146*  K 4.5  --  5.2*  --  5.2* 3.2* 3.3*  CL 108  --  108  --  109 105 105  CO2 23  --  22  --  22 32 33*  GLUCOSE 103*  --  118*  --  82 189* 143*  BUN 20  --  22  --  28* 27* 18  CREATININE 0.80  --  0.70  --  0.67 0.70 0.56  CALCIUM 7.9*  --  8.0*  --  8.1* 8.0* 7.6*  MG  --    < > 1.9 2.4 2.2 1.7 1.9  PHOS  --    < > 4.8* 2.9 3.5 1.1* 1.6*   < > = values in this interval not displayed.   Estimated Creatinine Clearance: 60 mL/min (by C-G formula based on SCr of 0.56 mg/dL).   LIVER Recent Labs  Lab 2018/06/01 1350 2018/06/01 1747  09/05/18 0047 09/07/18 0342 09/09/18 0414  AST 91*  --   --  69* 55*  ALT 60*  --   --  59* 41  ALKPHOS 67  --   --  86 59  BILITOT 0.8  --   --  0.5 0.3  PROT 6.3*  --   --  5.4* 5.3*  ALBUMIN 3.3*  --   --  2.4* 2.0*  INR  --  1.16 1.11  --   --      INFECTIOUS Recent Labs  Lab 2018/06/01 1402 09/07/18 0034 09/09/18 1010 09/10/18 0406  LATICACIDVEN 10.33* 1.3  1.9  --   PROCALCITON  --   --  0.81 0.53     ENDOCRINE CBG (last 3)  Recent Labs    09/09/18 2358 09/10/18 0352 09/10/18 0810  GLUCAP 138* 136* 149*    CXR 09/10/18 - Reviewed personally by me Small right apical PTX. Severe subcutaneous emphysema. ETT in place  Resolved Hospital Problem list     Assessment & Plan:  77 year old female with severe COPD and chronic hypoxemic respiratory failure admitted status post cardiac arrest.  Prior to admission she had sudden witnessed collapse and underwent immediate CPR x 16 minutes before EMS arrived.  Initial rhythm was PEA and ACLS was performed for 60 minutes before achieving ROSC.  Cardiac rhythm showed third-degree heart block patient was transcutaneously paced.  Admitted to PCCM for management.  Cardiac Arrest Initial rhythm was PEA.  Status post epinephrine x2 after achieving ROSC patient had complete heart block.  Cardiology placed transvenous pacemaker.  S/p re-warming. ECHO 09/05/18 - EF 35% with diffuse hypokinesis and moderate RV dilatation.  Prior echo and stress test reportedly normal. -MAP goal > 65 -cards following  Postarrest coma Concern for anoxic brain injury Currently off continuous sedation with no evidence of purposeful movements. -Remain off continuous sedation -PRN fentanyl with goal RASS -1 -Neuro following -Keppra per neuro -MRI brain today  Acute on chronic hypoxemic and hypercapnic respiratory failure Inability to protect airway Severe COPD [facility] FEV1 35%.  DLCO severely reduced.  On 3 L  O2 at home.  Currently not a candidate for SBT due to inability to protect airway related to multiple medical issues -Continue full ventilatory support -Repeat ABG -Vent bundle -Continue bronchodilators  Electrolyte imbalance Hypokalemia, hypomagnesia and hypophosphatemia -Serial BMP, phosphorus, mag -Replete potassium and phosphorus today  Fever  Last fever on 12/29. Admission cultures NGTD. RVP, urine strep negative. LA <2. -Follow-up cultures. -Continue Vanc and Aztreonam (cephalosporin allergy). De-escalate if cultures remain negative -Tylenol and cooling blanket  R Pneumothorax  Subcutaneous Emphysema S/p Chest tube placement on 12/29 however dislodged overnight. Repeat CXR this am with small apical PTX. For SQ emphysema will provide supportive management with vent however this may be limited in setting of respiratory acidosis -Repeat CXR tomorrow am to evaluate PTX -Supportive management for SQ emphysema: Goal Pplat <30, low TV   Best practice:  Diet: NPO Pain/Anxiety/Delirium protocol (if indicated): Fent, versed, nimbex VAP protocol (if indicated): yes DVT prophylaxis: heparin / scd's GI prophylaxis: pepcid Glucose control: SSI Mobility: BR Code Status: No CPR, no shock. Pressors OK Family Communication: Updated daughter at bedside Disposition: Remain in ICU   The patient is critically ill with multiple organ systems failure and requires high complexity decision making for assessment and support, frequent evaluation and titration of therapies, application of advanced monitoring technologies and extensive interpretation of multiple databases.   Critical Care Time devoted to patient care services described in this note is  40 Minutes. This time reflects time of care of this signee Dr. Mechele CollinJane Colie Fugitt. This critical care time does not reflect procedure time, or teaching time or supervisory time of PA/NP/Med student/Med Resident etc but could involve care discussion  time.  Mechele CollinJane Dexter Signor, M.D. [facility] Pulmonary/Critical Care Medicine. Pager: [phone]. After hours pager: [phone].

## 2018-09-10 NOTE — Progress Notes (Addendum)
MD made aware of subcutaneous emphysema in rt upper arm, rt chest and left chest.

## 2018-09-10 NOTE — Progress Notes (Signed)
Subjective: No significant changes  Exam: Vitals:   09/10/18 1925 09/10/18 2012  BP:    Pulse:    Resp:    Temp:  99.7 F (37.6 C)  SpO2: 100%    Gen: In bed, intubated Resp: ventilated  Abd: soft, nt  Neuro: MS: does not open eyes or follow commands CN: PERRL, corneals intact Motor: She has flexion to noxious stimulation of bilateral legs in the left arm, no movement to the right arm Sensory:as above.    Impression: 77 yo F with anoxic brain injury.  Currently she has intact brainstem and flexion response at 72 hours, though I remain very concerned about her prognosis, this is an indeterminate exam.  I think we should get an MRI and if this does demonstrate significant anoxic injury, then I think this could be helpful in prognostication.  Recommendations: 1) MRI brain 2) we will follow  Ritta SlotMcNeill Renji Berwick, MD [facility] [phone]  If 7pm- 7am, please page neurology on call as listed in AMION.

## 2018-09-10 NOTE — Progress Notes (Signed)
Chest tube is out on the x-ray it was removed compression dressing was put in repeat x-ray in 5 to 6 hours increase FiO2 200% PEEP is minimal at 6 if reaccumulation put another chest tube.

## 2018-09-11 ENCOUNTER — Inpatient Hospital Stay (HOSPITAL_COMMUNITY): Payer: Medicare Other

## 2018-09-11 DIAGNOSIS — N3000 Acute cystitis without hematuria: Secondary | ICD-10-CM

## 2018-09-11 LAB — GLUCOSE, CAPILLARY
GLUCOSE-CAPILLARY: 124 mg/dL — AB (ref 70–99)
Glucose-Capillary: 128 mg/dL — ABNORMAL HIGH (ref 70–99)
Glucose-Capillary: 98 mg/dL (ref 70–99)

## 2018-09-11 LAB — PHOSPHORUS: Phosphorus: 3.2 mg/dL (ref 2.5–4.6)

## 2018-09-11 LAB — PROCALCITONIN: Procalcitonin: 0.47 ng/mL

## 2018-09-11 LAB — CULTURE, RESPIRATORY W GRAM STAIN

## 2018-09-11 LAB — URINE CULTURE: Culture: >=100000 — AB

## 2018-09-11 LAB — MAGNESIUM: MAGNESIUM: 1.7 mg/dL (ref 1.7–2.4)

## 2018-09-11 LAB — CULTURE, RESPIRATORY

## 2018-09-11 MED ORDER — GLYCOPYRROLATE 1 MG PO TABS
1.0000 mg | ORAL_TABLET | ORAL | Status: DC | PRN
  Filled 2018-09-11: qty 1

## 2018-09-11 MED ORDER — MORPHINE SULFATE (PF) 2 MG/ML IV SOLN
2.0000 mg | INTRAVENOUS | Status: DC | PRN
  Administered 2018-09-11 (×4): 4 mg via INTRAVENOUS
  Filled 2018-09-11 (×4): qty 2

## 2018-09-11 MED ORDER — GLYCOPYRROLATE 0.2 MG/ML IJ SOLN: 0.2000 mg | INTRAMUSCULAR | Status: DC | PRN

## 2018-09-11 MED ORDER — POLYVINYL ALCOHOL 1.4 % OP SOLN
1.0000 [drp] | Freq: Four times a day (QID) | OPHTHALMIC | Status: DC | PRN
  Filled 2018-09-11: qty 15

## 2018-09-11 MED ORDER — MAGNESIUM SULFATE 2 GM/50ML IV SOLN: 2.0000 g | Freq: Once | INTRAVENOUS | Status: DC

## 2018-09-11 MED ORDER — MIDAZOLAM HCL 2 MG/2ML IJ SOLN
2.0000 mg | Freq: Once | INTRAMUSCULAR | Status: AC
  Administered 2018-09-11: 2 mg via INTRAVENOUS
  Filled 2018-09-11: qty 2

## 2018-09-11 MED ORDER — FUROSEMIDE 20 MG PO TABS: 20.0000 mg | ORAL_TABLET | Freq: Once | ORAL | Status: DC

## 2018-09-12 NOTE — Progress Notes (Signed)
NAME:  Sheri Alvarez, MRN:  [id number], DOB:  03/16/1941, LOS: 7 ADMISSION DATE:  29-Aug-2018, CONSULTATION DATE: September 04, 2018 REFERRING MD:  Dr Silverio LayYao, CHIEF COMPLAINT: Cardiac arrest  Brief History   78 year old female with past medical history as below, which is significant for COPD (sept 2019 - fev 0.6L/35%, DLCO 8/43% - gold stage 3 bordering on 4,  Class 3 dyspnea, co2 retainer - baseline bic 30) on home O2 and  Hypertension.  She is followed by Dr. Elesa MassedWard the pulmonary clinic who notes that on her best days she cannot walk 100 yards without having to stop due to shortness of breath.  She broke her hip about a year ago and had a replacement and since that time is been unable to lower on her own.  Her daughter had been living with her and reading these were recently high they have come back to [place].  In the morning of 12/24 the patient had no complaints and went about her morning as usual.  She went to the nail salon with her daughter where upon entering she complained of shortness of breath and very shortly after collapsed and was pulseless.  CPR was nearly immediately started by daughter and continued once fire and EMS arrived.  Initial rhythm was PEA and ACLS was done for about 60 minutes prior to ROSC.  Rhythm was then bradycardic and found to be complete heart block.  Transcutaneous pacing was initiated.  Ice packs were applied in the emergency department and cardiology consulted there.  She was taken to the cardiac catheterization lab to have a transvenous pacer placed and potentially further diagnostic work-up.  PCCM was asked to admit.  Past Medical History  COPD, HTN  Significant Hospital Events   12/24 admit, Intubated, ICU, Transvenous pacer.  12/27 - S/p rewarming. Had ongoing respiratory acidosis overnight, vent adjusted accordingly. Repeat ABG this AM improved but still with some resp acidosis.  Vent already set at RR 30 with TV 500. Did not have purposeful movements  after fentanyl weaned AM 12/27.  12/28 - significant resp acidosis. Bic started. EEG per neuro c/w general enceophalopathy Unable to come off fent gtt due to vent dysnchrony., Daughter POA - > patient never wanted to be "vegetable".   Consults:  Cardiology 12/24-12/28  Procedures:  Transvenous pacemaker 12/24 > ETT 12/24 > L radial a line 12/24 >   Significant Diagnostic Tests:  CT head 12/24 > no acute findings. EEG 12/24 > no seizures recorded (but did have cortical irritability which places here at higher risk for seizures). Echo 12/25 > EF 35-40% with diffuse hypokinesis, G1DD, moderate RV dilatation with normal systolic function Thyroid US 12/26 > 5.5cm right inferior thyroid nodule.  Meets criteria for elective FNA. CT head 12/27 >  Nil acute EEG 12/27 > generalized nonspecific cerebral dysfunction (encephalopathy  Micro Data:  Blood 12/24 > neg as of 12/29 Urine 12/24 > neg as of 12./29 ...... Blood 12/29 Urine 12/29 Track 12/29 RVP 12/29 Urine leg 12/29 Urine strep 12/29   Antimicrobials:  Vanc 12/29>> Aztreonam 12/29>>  Interim history/subjective:  Updated daughter on patient's current medical condition including MRI suggesting anoxic brain injury.  Based on patient's clinical status, we discussed goals of care.  Objective   Blood pressure 135/72, pulse 86, temperature 100 F (37.8 C), resp. rate 19, height 5\' 3"  (1.6 m), weight 82.4 kg, SpO2 94 %. CVP:  [7 mmHg-9 mmHg] 8 mmHg  Vent Mode: PRVC FiO2 (%):  [50 %-100 %]  50 % Set Rate:  [22 bmp] 22 bmp Vt Set:  [450 mL-500 mL] 450 mL PEEP:  [6 cmH20] 6 cmH20 Plateau Pressure:  [13 cmH20-22 cmH20] 14 cmH20   Intake/Output Summary (Last 24 hours) at 10/09/2018 0759 Last data filed at Oct 09, 2018 0540 Gross per 24 hour  Intake 3857.89 ml  Output 2525 ml  Net 1332.89 ml   Filed Weights   09/09/18 0500 09/10/18 0246 October 09, 2018 0500  Weight: 76.3 kg 80.2 kg 82.4 kg    Physical Exam: General: Unresponsive,  intubated, subcutaneous air HENT: Sheri Alvarez, AT, ETT in place Eyes: Downward deviation bilaterally, absent oculocephalic reflex Respiratory: Vented breath sounds  Cardiovascular: Irregularly irregular, no MGR  GI: BS+, soft, nontender Extremities: 1+ pitting edema in extremities x4 Neuro: Unresponsive, no withdrawal to pain, no spontaneous movement  Skin: Subcutaneous crepitus, edematous GU: Foley in place  LABS    PULMONARY Recent Labs  Lab 09/07/18 0652 09/07/18 0757 09/07/18 1100 09/08/18 0409 09/10/18 1114  PHART 7.153* 7.203* 7.258* 7.184* 7.448  PCO2ART 71.4* 64.0* 54.0* 60.8* 48.5*  PO2ART 89.0 94.0 76.0* 95.0 47.0*  HCO3 25.1 25.2 24.1 22.9 33.6*  TCO2 27 27 26 25  35*  O2SAT 93.0 95.0 92.0 95.0 83.0    CBC Recent Labs  Lab 09/08/18 0346 09/09/18 0414 09/10/18 0406  HGB 10.8* 9.7* 9.0*  HCT 36.1 31.7* 29.4*  WBC 5.0 5.5 6.2  PLT 177 190 143*    COAGULATION Recent Labs  Lab 08/22/2018 1747 09/05/18 0047  INR 1.16 1.11    CARDIAC   Recent Labs  Lab 08/24/2018 1747 09/05/18 0047 09/05/18 0421 09/05/18 0830  TROPONINI 0.21* 0.18* 0.16* 0.13*   No results for input(s): PROBNP in the last 168 hours.   CHEMISTRY Recent Labs  Lab 09/07/18 1647 09/08/18 0346 09/09/18 0414 09/10/18 0406 09/10/18 1650 09/10/18 1732 October 09, 2018 0531  NA  --  141 147* 146* MARKED HEMOLYSIS 145  --   K  --  5.2* 3.2* 3.3* MARKED HEMOLYSIS 4.3  --   CL  --  109 105 105 MARKED HEMOLYSIS 102  --   CO2  --  22 32 33* MARKED HEMOLYSIS 33*  --   GLUCOSE  --  82 189* 143* MARKED HEMOLYSIS 101*  --   BUN  --  28* 27* 18 MARKED HEMOLYSIS 17  --   CREATININE  --  0.67 0.70 0.56 MARKED HEMOLYSIS 0.51  --   CALCIUM  --  8.1* 8.0* 7.6* MARKED HEMOLYSIS 7.9*  --   MG 2.4 2.2 1.7 1.9  --   --  1.7  PHOS 2.9 3.5 1.1* 1.6* MARKED HEMOLYSIS 2.8 3.2   Estimated Creatinine Clearance: 60.8 mL/min (by C-G formula based on SCr of 0.51 mg/dL).   LIVER Recent Labs  Lab 08/12/2018 1350  09/01/2018 1747 09/05/18 0047 09/07/18 0342 09/09/18 0414  AST 91*  --   --  69* 55*  ALT 60*  --   --  59* 41  ALKPHOS 67  --   --  86 59  BILITOT 0.8  --   --  0.5 0.3  PROT 6.3*  --   --  5.4* 5.3*  ALBUMIN 3.3*  --   --  2.4* 2.0*  INR  --  1.16 1.11  --   --      INFECTIOUS Recent Labs  Lab 08/29/2018 1402 09/07/18 0034 09/09/18 1010 09/10/18 0406 October 09, 2018 0531  LATICACIDVEN 10.33* 1.3 1.9  --   --   PROCALCITON  --   --  0.81 0.53 0.47     ENDOCRINE CBG (last 3)  Recent Labs    09/10/18 1944 2018-05-31 0014 2018-05-31 0307  GLUCAP 91 128* 98    CXR 09/10/18 - Reviewed personally by me Small right apical PTX. Severe subcutaneous emphysema. ETT in place  CXR 2018-05-31 - Reviewed by me Stable right apical PTX, unchanged subcutaneous emphysema. ETT in place  Resolved Hospital Problem list     Assessment & Plan:  78 year old female with severe COPD and chronic hypoxemic respiratory failure admitted status post cardiac arrest.  Prior to admission she had sudden witnessed collapse and underwent immediate CPR x 16 minutes before EMS arrived.  Initial rhythm was PEA and ACLS was performed for 60 minutes before achieving ROSC.  Cardiac rhythm showed third-degree heart block patient was transcutaneously paced.  Admitted to PCCM for management.  Cardiac Arrest Initial rhythm was PEA.  Status post epinephrine x2 after achieving ROSC patient had complete heart block.  Cardiology placed transvenous pacemaker.  S/p re-warming. ECHO 09/05/18 - EF 35% with diffuse hypokinesis and moderate RV dilatation.  Prior echo and stress test reportedly normal. -MAP goal > 65 -cards following  Postarrest coma with subsequent anoxic brain injury Currently off continuous sedation with no evidence of purposeful movements. -Remain off continuous sedation -PRN fentanyl with goal RASS -1 -Neuro following -Keppra per neuro  Acute on chronic hypoxemic and hypercapnic respiratory  failure Inability to protect airway Severe COPD Followed by Dr. Sherene SiresWert at [facility] for COPD FEV1 35%.  DLCO severely reduced.  On 3 L O2 at home.  Currently not a candidate for SBT due to inability to protect airway related to multiple medical issues -Continue full ventilatory support. Wean settings as tolerated -Diurese as tolerated: Lasix 20 mg now -Vent bundle -Continue bronchodilators -Discontinue sodium bicarbonate gtt for resolving acute respiratory acidosis  Persistent Fever Pseudomonas Aeruginosa UTI Admission cultures NGTD. UCx grew >100K P. Aeruginosa. RQ with S.Aureus, sensitivity pending RVP, urine strep negative. LA <2. -Follow-up final cultures -Continue Vanc and Aztreonam (cephalosporin allergy) -Tylenol and cooling blanket  Electrolyte imbalance Hypokalemia, hypomagnesia and hypophosphatemia -Serial BMP, phosphorus, mag -Replete magnesium today  R Pneumothorax  Subcutaneous Emphysema S/p Chest tube placement on 12/29 however dislodged same night. Stable small apical PTX on repeat CXR. For SQ emphysema will provide supportive management with vent however this may be limited in setting of respiratory acidosis -Supportive management for SQ emphysema: Goal Pplat <30, Reduced TV to 7cc/kg -CXR PRN if respiratory status declines    Best practice:  Diet: NPO Pain/Anxiety/Delirium protocol (if indicated): Fent, versed, nimbex VAP protocol (if indicated): yes DVT prophylaxis: heparin / scd's GI prophylaxis: pepcid Glucose control: SSI Mobility: BR Code Status: No CPR, no shock. Pressors OK Family Communication: Updated daughter on patient's medical condition and discussed goals of care Disposition: Remain in ICU   The patient is critically ill with multiple organ systems failure and requires high complexity decision making for assessment and support, frequent evaluation and titration of therapies, application of advanced monitoring technologies and extensive  interpretation of multiple databases.   Critical Care Time devoted to patient care services described in this note is  65 Minutes. This time reflects time of care of this signee Dr. Mechele CollinJane Yelina Sarratt. This critical care time does not reflect procedure time, or teaching time or supervisory time of PA/NP/Med student/Med Resident etc but could involve care discussion time.  Mechele CollinJane Jovahn Breit, M.D. [facility]/Critical Care Medicine. Pager: [phone]. After hours pager: [phone].

## 2018-09-12 NOTE — Significant Event (Signed)
Daughter at bedside requesting update on patient's medical status.  We discussed active medical issues including patient's recent MRI suggesting anoxic brain injury, acute encephalopathy and worsening acute on chronic respiratory failure (oxygen requirement increased to 90%). Daughter expressed that patient would not want to be on prolonged life support if this would not lead to meaningful recovery. After our discussion, daughter wishes to pursue comfort care and compassionate extubation.  CODE STATUS changed to DNR/comfort.

## 2018-09-12 NOTE — Progress Notes (Signed)
Nutrition Brief Note  Chart reviewed. Pt now transitioning to comfort care. No further nutrition interventions warranted at this time.  Please re-consult as needed.    Nickolaus Bordelon MS, RD, LDN, CNSC [phone] Pager  [phone] Weekend/On-Call Pager     

## 2018-09-12 NOTE — Progress Notes (Signed)
I responded to a page from the nurse to provide spiritual support for the family. I visited the patient's room with her daughter, Waynetta SandyBeth, and son-in-law, Chrissie NoaWilliam, present. I provided spiritual support by sharing words of encouragement and by leading in prayer. Beth requested a priest provide last rites for her mother, who is a member of [place] parish in [place]. I shared that I would make contact with our local priest and share her request with him. I shared that the Chaplain is available to provide additional support as needed or requested.    08/18/2018 1000  Clinical Encounter Type  Visited With Patient and family together  Visit Type Follow-up;Spiritual support  Referral From Nurse  Consult/Referral To Chaplain  Spiritual Encounters  Spiritual Needs Emotional;Prayer  Stress Factors  Patient Stress Factors None identified  Family Stress Factors Exhausted    Chaplain Dr Melvyn NovasMichael Ramandeep Arington

## 2018-09-12 NOTE — Progress Notes (Signed)
Subjective: MRI with evidence of anoxic injury Exam: Vitals:   2018/05/29 0900 2018/05/29 1000  BP: 117/66   Pulse: 92 84  Resp: 15 (!) 22  Temp: 99.5 F (37.5 C) 99.7 F (37.6 C)  SpO2: 100% 96%   Gen: In bed, intubated Resp: ventilated  Abd: soft, nt Skin: Diffuse subcutaneous emphysema  Neuro: MS: does not open eyes or follow commands CN: PERRL, corneals intact Motor: She has flexion to noxious stimulation in all 4 extremities Sensory:as above.    Impression: 78 yo F with anoxic brain injury.  She is not currently appearing to make much progress.  MRI confirms that she has had some degree of anoxic injury.  Given her lack of improvement, and the daughter statement that her wishes have been to not be in a state of total dependence, I discussed with the daughter that I felt that this was the most likely outcome of her current state.  In this setting, the daughter feels that the patient would not want continued aggressive care.  Recommendations: 1) agree with comfort only care 2) please call if neurology can be of any further assistance  Ritta SlotMcNeill Kirkpatrick, MD [facility] [phone]  If 7pm- 7am, please page neurology on call as listed in AMION.

## 2018-09-12 NOTE — Progress Notes (Signed)
Pt transported to MRI. RT and RN remained with pt until scan was completed. Pt return to unit and new room ([id number]) without complications.

## 2018-09-12 NOTE — Progress Notes (Signed)
I completed a follow up visit with the patient and her family. While visiting, the patient expired. I provided spiritual support to the family with words of comfort and prayer.    08/14/2018 1630  Clinical Encounter Type  Visited With Patient and family together  Visit Type Follow-up;Spiritual support;Death  Referral From Nurse  Consult/Referral To Chaplain  Spiritual Encounters  Spiritual Needs Grief support;Emotional;Prayer  Stress Factors  Family Stress Factors Exhausted    Chaplain Dr Melvyn NovasMichael Zelena Bushong

## 2018-09-12 NOTE — Progress Notes (Signed)
I completed a follow up with respect to the patient's request for a priest. I spoke with Father Lindaann SloughWilliam Allegretto at [facility] concerning the patient's request (through her daughter) for last rites. Father Allegretto is notifying a priest to meet and serve the patient and her family.  Chaplain Dr Melvyn NovasMichael Jalysa Swopes

## 2018-09-12 NOTE — Progress Notes (Signed)
Generalized tremors no longer noted

## 2018-09-12 NOTE — Progress Notes (Signed)
Patient extubated per "withdrawal of care" per family request.  Patient tolerated well.

## 2018-09-12 DEATH — deceased

## 2018-09-14 LAB — CULTURE, BLOOD (ROUTINE X 2)
Culture: NO GROWTH
Culture: NO GROWTH
SPECIAL REQUESTS: ADEQUATE
Special Requests: ADEQUATE

## 2018-09-17 ENCOUNTER — Ambulatory Visit: Payer: Medicare Other | Admitting: Internal Medicine

## 2018-09-19 ENCOUNTER — Telehealth: Payer: Self-pay | Admitting: *Deleted

## 2018-09-19 NOTE — Telephone Encounter (Signed)
Received D/C from [facility]-D/C forwarded to Dr.Ellison to be signed.

## 2018-09-24 ENCOUNTER — Telehealth: Payer: Self-pay | Admitting: Pulmonary Disease

## 2018-09-24 NOTE — Telephone Encounter (Signed)
I received D/C back from Dr.Ellison at [facility] signing it. I Sheri Al[facility] @ [phone] they will pickup this afternoon. PWR

## 2018-10-13 NOTE — Death Summary Note (Signed)
DEATH SUMMARY   Patient Details  Name: Sheri Alvarez MRN: [id number] DOB: Oct 25, 1940  Admission/Discharge Information   Admit Date:  September 09, 2018  Date of Death: Date of Death: Sep 16, 2018  Time of Death: Time of Death: 01-22-18  Length of Stay: 7  Referring Physician: Marlowe Kays, MD   Reason(s) for Hospitalization  Post-cardiac arrest   Diagnoses  Preliminary cause of death:  Secondary Diagnoses (including complications and co-morbidities):  Active Problems:   Chronic obstructive pulmonary disease (HCC)   Cardiac arrest, cause unspecified (HCC)   Cardiac arrest (HCC)   Complete heart block (HCC)   Respiratory failure (HCC)   Secondary spontaneous pneumothorax   Brief Hospital Course (including significant findings, care, treatment, and services provided and events leading to death)  Sheri Alvarez is a 78 y.o. year old female with severe COPD and chronic hypoxemic respiratory failure admitted for status post cardiac arrest.  Prior to admission she had sudden witnessed collapse and underwent immediate CPR x16 minutes before EMS arrived.  Initial rhythm was PEA and ACLS was performed for 60 minutes before achieving ROSC.  Cardiac rhythm showed third-degree heart block, patient was transcutaneously paced and admitted to [facility] for management.  Hospital course was complicated by postarrest anoxic brain injury, acute on chronic hypoxic and hypercapnic respiratory failure secondary to S.aureus and P. Aeruginosa pneumonia, pseudomonas aeruginosa UTI, right pneumothorax and electrolyte imbalance.  Due to patient's decreased responsiveness, MRI was obtained and demonstrated significant anoxic brain injury.  Family discussion was held and decision was made to transition patient to comfort care with compassionate extubation. Patient expired at 1619.   Cause of death: Anoxic brain injury due to cardiac arrest likely related to hypoxemia with COPD  Pertinent Labs and Studies  Significant  Diagnostic Studies Dg Chest 1 View  Result Date: 09/09/2018 CLINICAL DATA:  78 year old female with history of right-sided chest tube placement for pneumothorax. Also intubated. EXAM: CHEST  1 VIEW COMPARISON:  Chest x-ray 09/09/2018. FINDINGS: New right-sided chest tube in position with tip projecting over the mid to upper right hemithorax. Side port is very near the margin of the right chest small. Previously noted pneumothorax is no longer identified. There is a right-sided internal jugular central venous catheter with tip terminating in the distal superior vena cava. An endotracheal tube is in place with tip 4.9 cm above the carina. A nasogastric tube is seen extending into the stomach, however, the tip of the nasogastric tube extends below the lower margin of the image. Worsening diffuse interstitial prominence and patchy multifocal airspace disease throughout the lungs bilaterally. Small bilateral pleural effusions. Cephalization of the pulmonary vasculature. Mild cardiomegaly. Upper mediastinal contours are distorted by patient positioning. Aortic atherosclerosis. IMPRESSION: 1. Support apparatus, as above. 2. Resolution of previously noted right-sided pneumothorax following placement of right-sided chest tube. 3. Worsening patchy multifocal interstitial and airspace disease throughout the lungs bilaterally. This may simply reflect pulmonary edema in the setting of mild congestive heart failure, however, the possibility of developing multilobar bronchopneumonia should also be considered if there are signs and symptoms of infection. Electronically Signed   By: Trudie Reed M.D.   On: 09/09/2018 15:41   Ct Head Wo Contrast  Result Date: 09/07/2018 CLINICAL DATA:  Cardiac arrest.  Anoxic brain injury. EXAM: CT HEAD WITHOUT CONTRAST TECHNIQUE: Contiguous axial images were obtained from the base of the skull through the vertex without intravenous contrast. COMPARISON:  CT head without contrast  09/05/2018 FINDINGS: Brain: No acute infarct, hemorrhage, or mass lesion  is present. Basal ganglia are intact. Gray-white differentiation is maintained. No cortical swelling is evident. No significant white matter disease is present. The brainstem and cerebellum are normal. Vascular: Carotid arteries atherosclerotic calcifications are present within and at the dural the cavernous internal margin of both vertebral arteries. Skull: Calvarium is intact. No focal lytic or blastic lesions are present. Soft tissue scalp swelling is present at the right occiput. This was not present previously. There is no underlying osseous lesion. Sinuses/Orbits: Mild mucosal thickening is present in the ethmoids bilaterally. A polyp or mucous retention cyst is evident in the right maxillary sinus. Fluid extends into the posterior nasal cavity. This is likely associated with intubation. Bilateral lens replacements are present. Globes and orbits are otherwise within normal limits. IMPRESSION: 1. Normal CT appearance of the brain. No evidence for anoxic injury. 2. Soft tissue swelling over the right occipital scalp without underlying osseous abnormality. 3. Fluid in the nasopharynx and posterior nasal cavity likely associated with intubation. Electronically Signed   By: Marin Roberts M.D.   On: 09/07/2018 10:45   Ct Head Wo Contrast  Result Date: 09/05/2018 CLINICAL DATA:  Cardiac arrest. Loss of consciousness. EXAM: CT HEAD WITHOUT CONTRAST CT CERVICAL SPINE WITHOUT CONTRAST TECHNIQUE: Multidetector CT imaging of the head and cervical spine was performed following the standard protocol without intravenous contrast. Multiplanar CT image reconstructions of the cervical spine were also generated. COMPARISON:  None. FINDINGS: CT HEAD FINDINGS Brain: The brain does not show accelerated atrophy for age. No sign of old or acute focal infarction, mass lesion, hemorrhage, hydrocephalus or extra-axial collection. Vascular: There is  atherosclerotic calcification of the major vessels at the base of the brain. Skull: Normal Sinuses/Orbits: Clear/normal Other: None CT CERVICAL SPINE FINDINGS Alignment: No significant malalignment. Skull base and vertebrae: No fracture or primary bone lesion. Soft tissues and spinal canal: Negative Disc levels: Chronic degenerative spondylosis from C2-3 through C6-7. Osteophytic encroachment upon the canal and foramina, most pronounced on the left at C4-5 and C6-7. Upper chest: Emphysema and scarring. Small effusions. Other: None IMPRESSION: 1. Head CT: No acute or traumatic finding. Normal for age. 2. Cervical spine CT: No acute or traumatic finding. Chronic degenerative spondylosis. Electronically Signed   By: Paulina Fusi M.D.   On: 09/05/2018 10:51   Ct Cervical Spine Wo Contrast  Result Date: 09/05/2018 CLINICAL DATA:  Cardiac arrest. Loss of consciousness. EXAM: CT HEAD WITHOUT CONTRAST CT CERVICAL SPINE WITHOUT CONTRAST TECHNIQUE: Multidetector CT imaging of the head and cervical spine was performed following the standard protocol without intravenous contrast. Multiplanar CT image reconstructions of the cervical spine were also generated. COMPARISON:  None. FINDINGS: CT HEAD FINDINGS Brain: The brain does not show accelerated atrophy for age. No sign of old or acute focal infarction, mass lesion, hemorrhage, hydrocephalus or extra-axial collection. Vascular: There is atherosclerotic calcification of the major vessels at the base of the brain. Skull: Normal Sinuses/Orbits: Clear/normal Other: None CT CERVICAL SPINE FINDINGS Alignment: No significant malalignment. Skull base and vertebrae: No fracture or primary bone lesion. Soft tissues and spinal canal: Negative Disc levels: Chronic degenerative spondylosis from C2-3 through C6-7. Osteophytic encroachment upon the canal and foramina, most pronounced on the left at C4-5 and C6-7. Upper chest: Emphysema and scarring. Small effusions. Other: None  IMPRESSION: 1. Head CT: No acute or traumatic finding. Normal for age. 2. Cervical spine CT: No acute or traumatic finding. Chronic degenerative spondylosis. Electronically Signed   By: Paulina Fusi M.D.   On: 09/05/2018 10:51  Mr Brain Wo Contrast  Result Date: 08/30/2018 CLINICAL DATA:  Anoxic brain injury. EXAM: MRI HEAD WITHOUT CONTRAST TECHNIQUE: Multiplanar, multiecho pulse sequences of the brain and surrounding structures were obtained without intravenous contrast. COMPARISON:  None. FINDINGS: BRAIN: There is mild diffusion restriction along both precentral gyri. No other diffusion abnormality. No acute hemorrhage. The midline structures are normal. No midline shift or other mass effect. There are no old infarcts. The white matter signal is normal for the patient's age. The cerebral and cerebellar volume are age-appropriate. Susceptibility-sensitive sequences show no chronic microhemorrhage or superficial siderosis. VASCULAR: Major intracranial arterial and venous sinus flow voids are normal. SKULL AND UPPER CERVICAL SPINE: Calvarial bone marrow signal is normal. There is no skull base mass. Visualized upper cervical spine and soft tissues are normal. SINUSES/ORBITS: Bilateral mastoid effusions.  The orbits are normal. IMPRESSION: 1. Mild diffusion restriction along both precentral gyri, a finding of hypoxic ischemic injury. 2. Otherwise normal aging brain. Electronically Signed   By: Deatra Robinson M.D.   On: 08/26/2018 04:54   Dg Chest Port 1 View  Result Date: 08/25/2018 CLINICAL DATA:  Intubation.  Cardiac arrest. EXAM: PORTABLE CHEST 1 VIEW COMPARISON:  09/10/2018. FINDINGS: Endotracheal tube, NG tube, right IJ line in stable position. Heart size stable. Bilateral pulmonary infiltrates/edema bilateral pleural effusions. Small right apical pneumothorax again noted. Extensive diffuse thoracic subcutaneous emphysema is again noted. Bilateral breast implants IMPRESSION: 1.  Lines and tubes in  stable position. 2. Small right apical pneumothorax again noted. Extensive diffuse chest wall subcutaneous emphysema again noted without interim change. 3.  Bilateral pulmonary infiltrates and bilateral pleural effusions. 4.  Extensive diffuse Electronically Signed   By: Maisie Fus  Register   On: 08/13/2018 06:29   Dg Chest Port 1 View  Result Date: 09/10/2018 CLINICAL DATA:  Pneumothorax and shortness of breath EXAM: PORTABLE CHEST 1 VIEW COMPARISON:  09/09/2018 FINDINGS: Unchanged tiny right apical pneumothorax. There is now widespread is subcutaneous emphysema throughout the chest. The right-sided chest tube is no longer present. Basilar predominant linear opacities, poorly visualized due to overlying subcutaneous emphysema. The endotracheal tube tip is in unchanged position approximately 3.5 cm above the inferior margin of the carina. There is a right internal jugular vein approach central venous catheter with tip in the lower SVC. IMPRESSION: 1. Unchanged size of small right apical pneumothorax. 2. Interval development of severe, widespread thoracic subcutaneous emphysema. Right-sided chest tube is no longer present. Electronically Signed   By: Deatra Robinson M.D.   On: 09/10/2018 02:42   Dg Chest Port 1 View  Result Date: 09/09/2018 CLINICAL DATA:  Follow-up pneumothorax. EXAM: PORTABLE CHEST 1 VIEW COMPARISON:  Chest radiograph performed earlier today at 3:27 p.m. FINDINGS: A trace residual right apical pneumothorax is noted; this was likely present on the prior study. The right-sided chest tube has been retracted somewhat, and its side port is now seen outside of the ribs. The patient's endotracheal tube is seen ending 4 cm above the carina. A right IJ line is noted ending about the distal SVC. An enteric tube is seen extending below the diaphragm. Small bilateral pleural effusions are noted. Vascular congestion is noted. Hazy bilateral airspace opacification, more prominent on the left, may reflect  asymmetric pulmonary edema or pneumonia. No pneumothorax is seen. The cardiomediastinal silhouette is borderline enlarged. No acute osseous abnormalities are identified. IMPRESSION: 1. Trace residual right apical pneumothorax noted; this was likely present on the prior study. The right-sided chest tube has been retracted somewhat, and its  side port is now seen outside of the ribs. 2. Small bilateral pleural effusions. Vascular congestion and borderline cardiomegaly. Hazy bilateral airspace opacification, more prominent on the left, may reflect asymmetric pulmonary edema or pneumonia. Electronically Signed   By: Roanna Raider M.D.   On: 09/09/2018 21:43   Dg Chest Port 1 View  Result Date: 09/09/2018 CLINICAL DATA:  Hx of pneumothorax. ETT present. Hx of HTN and COPD. Former smoker x50 years; quit in 2018 EXAM: PORTABLE CHEST 1 VIEW COMPARISON:  [id number] FINDINGS: Endotracheal tube is in place, tip 4.8 centimeters above the carina. A nasogastric tube is in place, beyond the gastroesophageal junction but off the image. A RIGHT IJ central line tip overlies the superior vena cava. There is a moderate RIGHT pneumothorax with moderate basilar component and increased apical component. Mildly prominent interstitial markings are identified throughout the lungs bilaterally. There is dense consolidation of both LOWER lobes. IMPRESSION: 1. Increased RIGHT pneumothorax. 2. Endotracheal tube is in place, tip 4.8 centimeters above the carina. 3. Bilateral LOWER lobe consolidations. 4. These results will be called to the ordering clinician or representative by the Radiologist Assistant, and communication documented in the PACS or zVision Dashboard. Electronically Signed   By: Norva Pavlov M.D.   On: 09/09/2018 14:34   Dg Chest Port 1 View  Result Date: 09/09/2018 CLINICAL DATA:  78 year old female with history of intubation. EXAM: PORTABLE CHEST 1 VIEW COMPARISON:  Chest x-ray 08/31/2018. FINDINGS: An endotracheal  tube is in place with tip 5.0 cm above the carina. There is a right-sided internal jugular central venous catheter with tip terminating in the distal superior vena cava. A nasogastric tube is seen extending into the stomach, however, the tip of the nasogastric tube extends below the lower margin of the image. New pneumothorax in the base of the right hemithorax, moderate in size occupying at least 20% of the volume of the right hemithorax. No left-sided pneumothorax. Patchy multifocal interstitial opacities are noted throughout the lungs bilaterally, similar to the prior examination. Diffuse peribronchial cuffing. Emphysematous changes. No evidence of pulmonary edema. Heart size is normal. Upper mediastinal contours are distorted by patient's rotation the left. IMPRESSION: 1. Support apparatus, as above. 2. New right-sided pneumothorax at least moderate in size, located predominantly in the base of the right hemithorax. 3. Diffuse interstitial prominence again noted throughout the lungs bilaterally, similar to recent prior examinations. 4. Emphysema Critical Value/emergent results were called by telephone at the time of interpretation on 09/09/2018 at 8:43 am to nurse [facility] for Dr. Kalman Shan, who verbally acknowledged these results. Electronically Signed   By: Trudie Reed M.D.   On: 09/09/2018 08:46   Dg Chest Port 1 View  Result Date: 09/08/2018 CLINICAL DATA:  Respiratory failure EXAM: PORTABLE CHEST 1 VIEW COMPARISON:  Yesterday FINDINGS: Endotracheal tube tip between the clavicular heads and carina. An orogastric tube at least reaches the stomach. Right IJ line with tip at the SVC. Large lung volumes with diffuse interstitial coarsening. Small pleural effusions, greater on the left. There is emphysematous changes the apices. Trachea is deviated to the left by a thyroid nodule based on cervical spine CT 09/05/2018 IMPRESSION: 1. Emphysema and small pleural effusions. 2. Stable hardware  positioning. Electronically Signed   By: Marnee Spring M.D.   On: 09/08/2018 08:25   Dg Chest Port 1 View  Result Date: 09/07/2018 CLINICAL DATA:  ETT EXAM: PORTABLE CHEST 1 VIEW COMPARISON:  09/06/2018 FINDINGS: Endotracheal tube terminates 2 cm above the carina. Mild bilateral  lower lobe opacities, likely atelectasis. No frank interstitial edema. No pleural effusion or pneumothorax. Right IJ venous catheter terminates at the cavoatrial junction. Defibrillator pads overlying the left hemithorax. Enteric tube coursing below the diaphragm. IMPRESSION: Endotracheal tube terminates 2 cm above the carina. Additional support apparatus as above. Mild bilateral lower lobe opacities, likely atelectasis. Electronically Signed   By: Charline BillsSriyesh  Krishnan M.D.   On: 09/07/2018 06:36   Dg Chest Port 1 View  Result Date: 09/06/2018 CLINICAL DATA:  Cardiac arrest, COPD EXAM: PORTABLE CHEST 1 VIEW COMPARISON:  09/05/2018 FINDINGS: Endotracheal tube 2.2 cm above the carina. Right IJ line tip: Cavoatrial junction. Nasogastric tube enters the stomach. Left upper tracheal deviation due to a large right thyroid mass. Severe emphysema. Mild enlargement of the cardiopericardial silhouette asymmetric interstitial accentuation on the left, although slightly improved from prior the left lung apex. New obscuration of left hemidiaphragm and mildly increased indistinctness of the right hemidiaphragm. Capsular calcification of bilateral breast implants. Atherosclerotic calcification of the aortic arch. IMPRESSION: 1. Asymmetric interstitial accentuation worse on the left than the right, potentially from asymmetric edema, atypical pneumonia, or less likely pulmonary embolus causing attenuated vasculature on the right. Correlate with the onset patient's symptoms in determining whether chest CT angiography is warranted. 2. New obscuration of left hemidiaphragm, probably from atelectasis. There is some similar increasing obscuration of the  right hemidiaphragm. 3. Leftward tracheal deviation due to a right thyroid mass. In the follow up nonacute setting, thyroid ultrasound is recommended for definitive characterization. 4. Tubes and lines appear satisfactorily positioned. 5. Mild enlargement of the cardiopericardial silhouette. 6. Capsular calcification of breast implants. Electronically Signed   By: Gaylyn RongWalter  Liebkemann M.D.   On: 09/06/2018 08:18   Dg Chest Port 1 View  Result Date: 09/05/2018 CLINICAL DATA:  Respiratory failure. EXAM: PORTABLE CHEST 1 VIEW COMPARISON:  Jan 13, 2018. FINDINGS: 0705 hours. Endotracheal tube tip is 4.1 cm above the base of the carina. The NG tube passes into the stomach although the distal tip position is not included on the film. Right IJ central line tip overlies the mid to distal SVC level. Lungs are hyperexpanded. Interstitial markings are diffusely coarsened with chronic features. Patchy airspace disease with left upper lung predominance is similar to prior. Bones are diffusely demineralized. Telemetry leads overlie the chest. IMPRESSION: No substantial interval change in exam. Electronically Signed   By: Kennith CenterEric  Mansell M.D.   On: 09/05/2018 08:57   Dg Chest Portable 1 View  Result Date: Jan 13, 2018 CLINICAL DATA:  Post CPR, history COPD, CHF, former smoker EXAM: PORTABLE CHEST 1 VIEW COMPARISON:  Portable exam 1354 hours compared 04/20/2018 FINDINGS: Tip of endotracheal tube projects 5.2 cm above carina. External pacing leads project over chest. Normal heart size, mediastinal contours, and pulmonary vascularity. Atherosclerotic calcification aorta. Emphysematous changes with mild bibasilar atelectasis. Hazy infiltrates likely edema throughout LEFT lung. No pleural effusion or pneumothorax. Bones demineralized. Age-indeterminate fracture of the lateral LEFT fourth rib. IMPRESSION: COPD changes with bibasilar atelectasis and mild diffuse LEFT lung infiltrate question edema. Electronically Signed   By: Ulyses SouthwardMark   Boles M.D.   On: Jan 13, 2018 14:59   [place] Thyroid  Result Date: 09/06/2018 CLINICAL DATA:  Goiter. EXAM: THYROID ULTRASOUND TECHNIQUE: Ultrasound examination of the thyroid gland and adjacent soft tissues was performed. COMPARISON:  None. FINDINGS: Parenchymal Echotexture: Mildly heterogenous Isthmus: 0.6 cm Right lobe: 7.7 x 4.2 x 3.7 cm Left lobe: 4.8 x 1.9 x 1.5 cm _________________________________________________________ Estimated total number of nodules >/= 1 cm: 1 Number of  spongiform nodules >/=  2 cm not described below (TR1): 0 Number of mixed cystic and solid nodules >/= 1.5 cm not described below (TR2): 0 _________________________________________________________ Nodule # 1: Location: Right; Inferior Maximum size: 5.5 cm; Other 2 dimensions: 4.4 x 3.8 cm Composition: solid/almost completely solid (2) Echogenicity: isoechoic (1) Shape: taller-than-wide (3) Margins: ill-defined (0) Echogenic foci: none (0) ACR TI-RADS total points: 6. ACR TI-RADS risk category: TR4 (4-6 points). ACR TI-RADS recommendations: **Given size (>/= 1.5 cm) and appearance, fine needle aspiration of this moderately suspicious nodule should be considered based on TI-RADS criteria. _________________________________________________________ Nodule # 2: Location: Left; Inferior Maximum size: 0.6 cm; Other 2 dimensions: 0.5 x 0.5 cm Composition: cystic/almost completely cystic (0) Echogenicity: anechoic (0) Shape: not taller-than-wide (0) Margins: smooth (0) Echogenic foci: none (0) ACR TI-RADS total points: 0. ACR TI-RADS risk category: TR1 (0-1 points). ACR TI-RADS recommendations: This nodule does NOT meet TI-RADS criteria for biopsy or dedicated follow-up. _________________________________________________________ No abnormal lymph nodes identified. IMPRESSION: 5.5 cm right inferior thyroid nodule meets criteria for elective fine-needle aspiration. Margins of this nodule are difficult to delineate due to thyroid goiter extending into  the upper mediastinum and therefore measurements may not be entirely accurate. However, maximum diameter is clearly on the order of 5 cm. The above is in keeping with the ACR TI-RADS recommendations - J Am Coll Radiol 2017;14:587-595. Electronically Signed   By: Irish Lack M.D.   On: 09/06/2018 16:22    Microbiology No results found for this or any previous visit (from the past 240 hour(s)).  Lab Basic Metabolic Panel: No results for input(s): NA, K, CL, CO2, GLUCOSE, BUN, CREATININE, CALCIUM, MG, PHOS in the last 168 hours. Liver Function Tests: No results for input(s): AST, ALT, ALKPHOS, BILITOT, PROT, ALBUMIN in the last 168 hours. No results for input(s): LIPASE, AMYLASE in the last 168 hours. No results for input(s): AMMONIA in the last 168 hours. CBC: No results for input(s): WBC, NEUTROABS, HGB, HCT, MCV, PLT in the last 168 hours. Cardiac Enzymes: No results for input(s): CKTOTAL, CKMB, CKMBINDEX, TROPONINI in the last 168 hours. Sepsis Labs: No results for input(s): PROCALCITON, WBC, LATICACIDVEN in the last 168 hours.  Procedures/Operations  Chest tube placement 09/09/18 EEG 09/07/18  Chi Mechele Collin 09/20/2018, 11:50 AM

## 2019-01-05 IMAGING — DX DG CHEST 1V PORT
1 series · 2 of 2 positions shown · non-contrast
Comparison: Portable exam 1091 hours compared 04/20/2018

CLINICAL DATA: Post CPR, history COPD, CHF, former smoker

EXAM:
PORTABLE CHEST 1 VIEW

[Series 1: chest · 0.14mm/px · 2 of 2 slices shown]
[im 1/2]
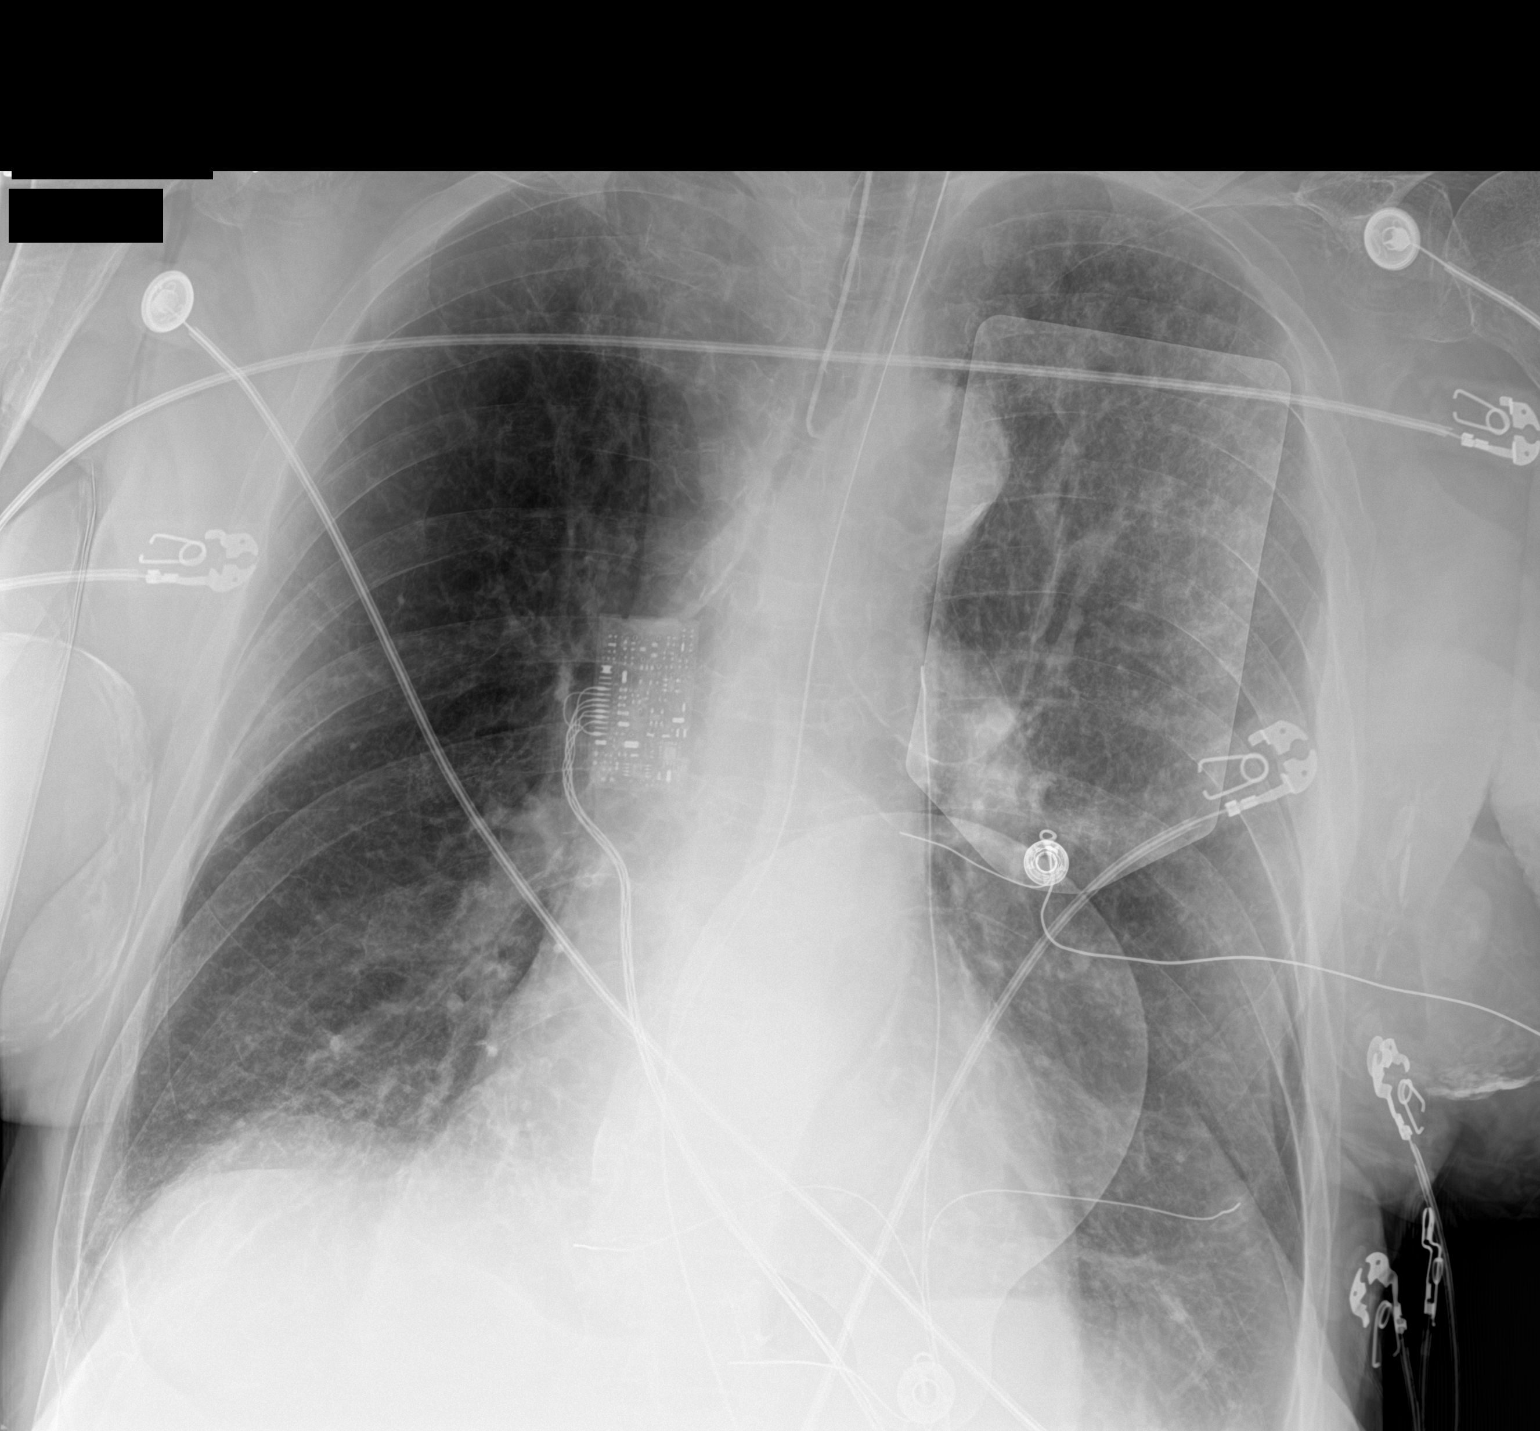
[im 2/2]
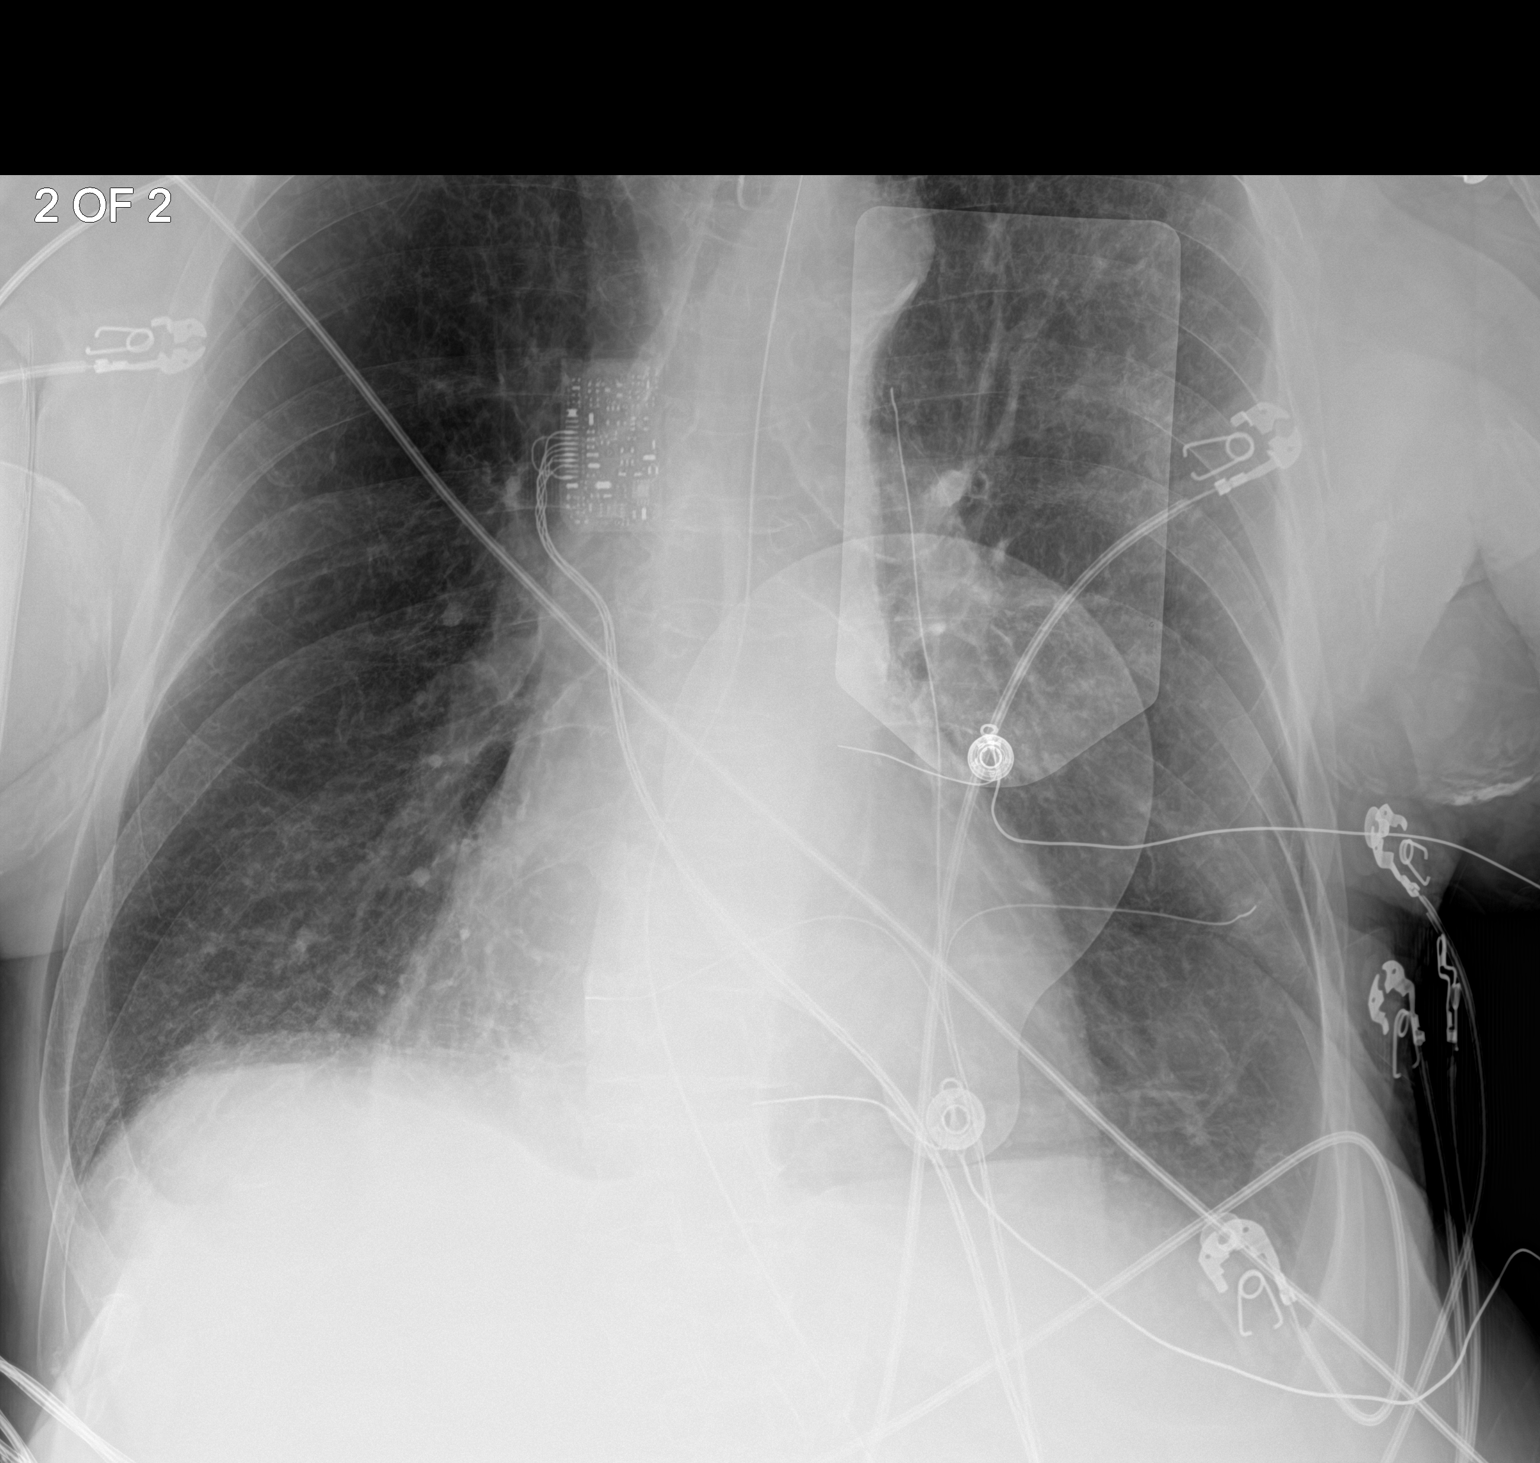

[2 of 2 positions shown; findings below may reference images not displayed]

FINDINGS: Tip of endotracheal tube projects 5.2 cm above carina.

External pacing leads project over chest.

Normal heart size, mediastinal contours, and pulmonary vascularity.

Atherosclerotic calcification aorta.

Emphysematous changes with mild bibasilar atelectasis.

Hazy infiltrates likely edema throughout LEFT lung.

No pleural effusion or pneumothorax.

Bones demineralized.

Age-indeterminate fracture of the lateral LEFT fourth rib.
IMPRESSION: COPD changes with bibasilar atelectasis and mild diffuse LEFT lung
infiltrate question edema.

## 2019-01-06 IMAGING — DX DG CHEST 1V PORT
1 series · 1 of 1 positions shown · non-contrast
Comparison: 09/04/2018.

CLINICAL DATA: Respiratory failure.

EXAM:
PORTABLE CHEST 1 VIEW

[chest]
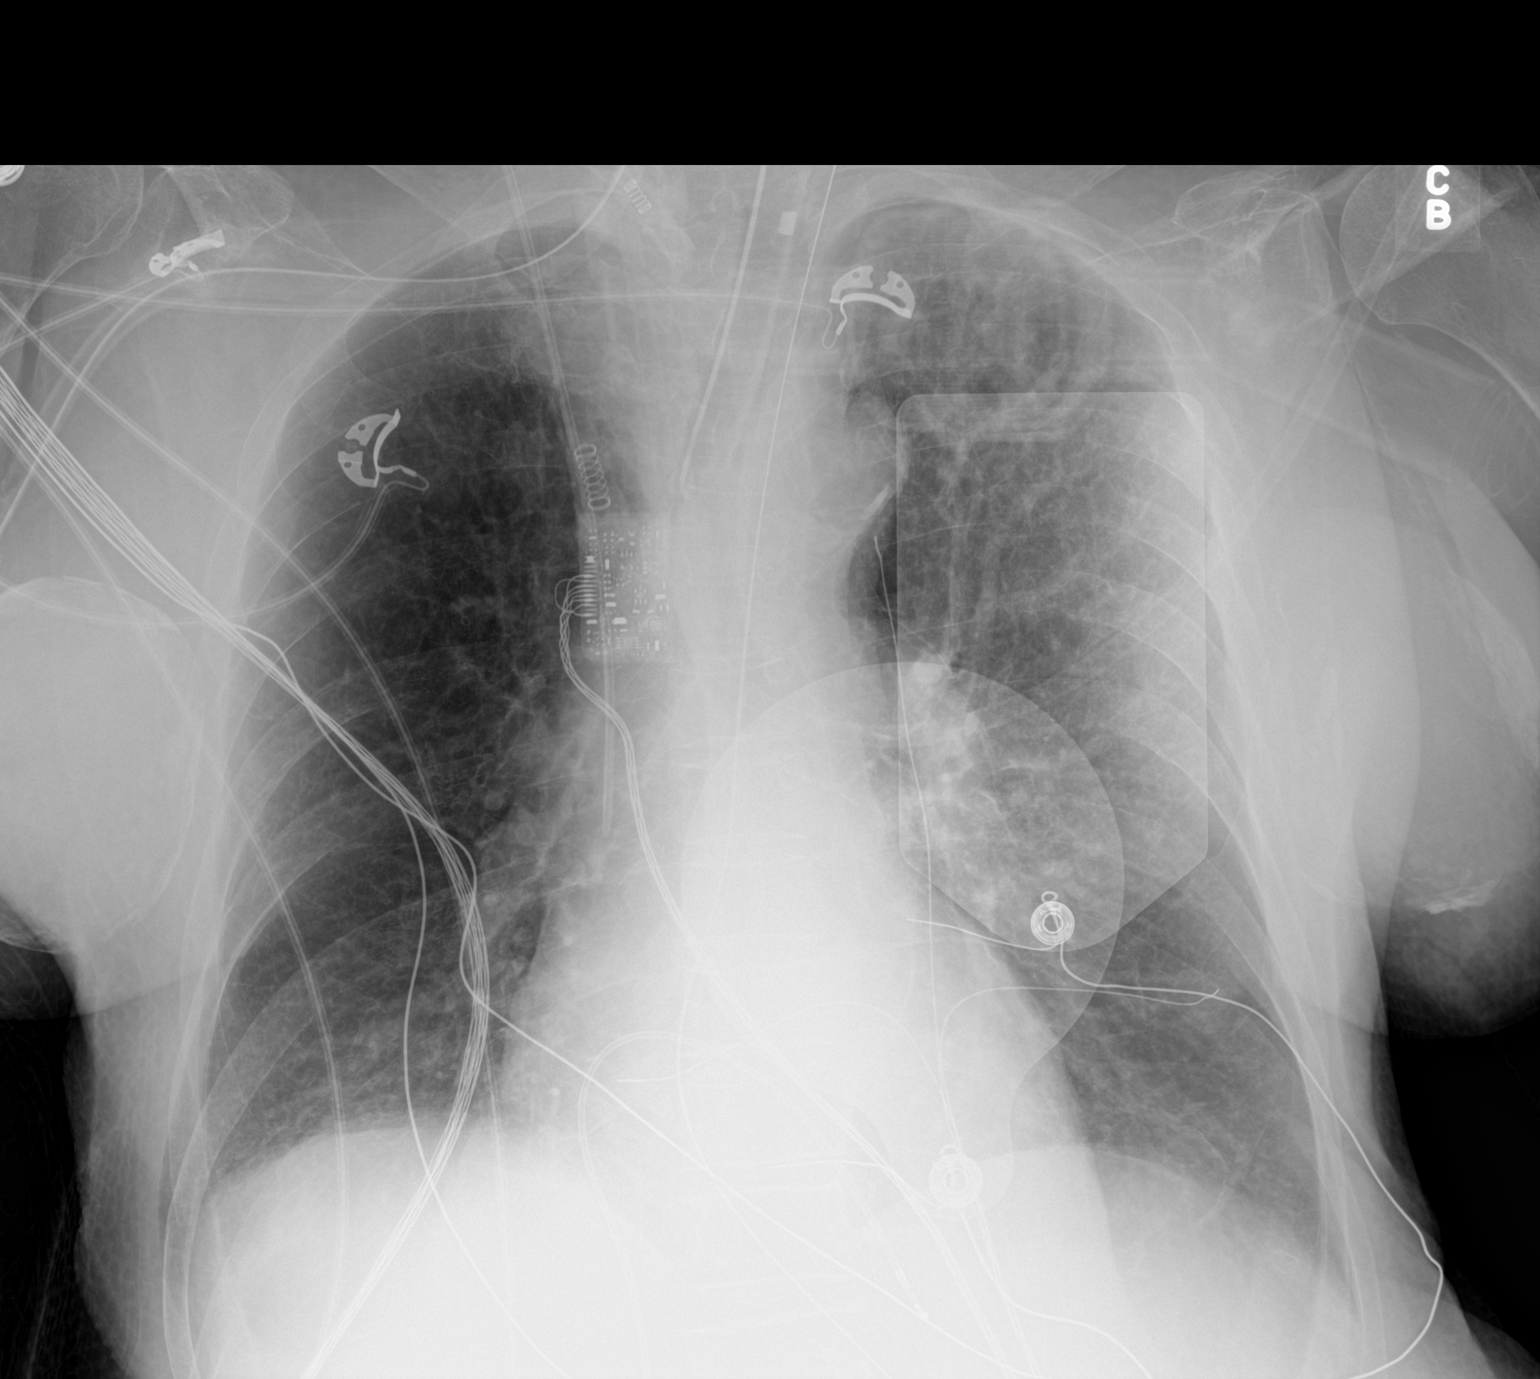

[1 of 1 positions shown; findings below may reference images not displayed]

FINDINGS: 0909 hours. Endotracheal tube tip is 4.1 cm above the base of the
carina. The NG tube passes into the stomach although the distal tip
position is not included on the film. Right IJ central line tip
overlies the mid to distal SVC level. Lungs are hyperexpanded.
Interstitial markings are diffusely coarsened with chronic features.
Patchy airspace disease with left upper lung predominance is similar
to prior. Bones are diffusely demineralized. Telemetry leads overlie
the chest.
IMPRESSION: No substantial interval change in exam.

## 2019-01-07 IMAGING — US US THYROID
1 series · 13 of 25 positions shown · non-contrast
Comparison: None.

CLINICAL DATA: Goiter.

EXAM:
THYROID ULTRASOUND
TECHNIQUE: Ultrasound examination of the thyroid gland and adjacent soft
tissues was performed.

[Series 1: us thyroid · 13 of 53 slices shown]
[im 1/53]
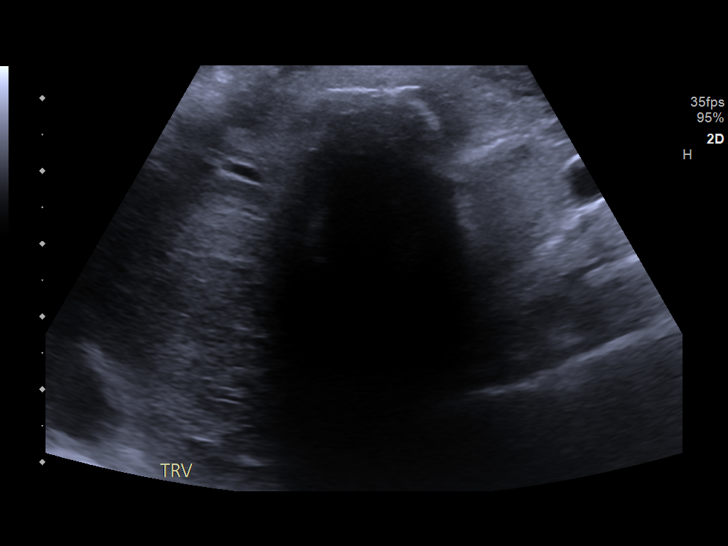
[im 5/53]
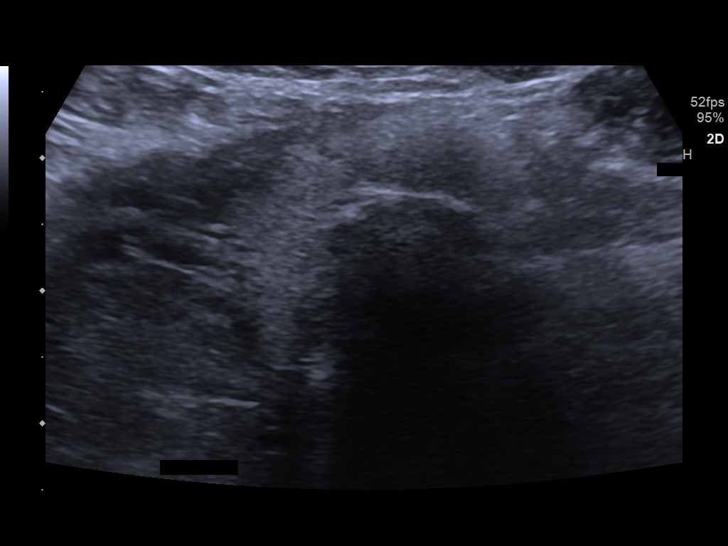
[im 9/53]
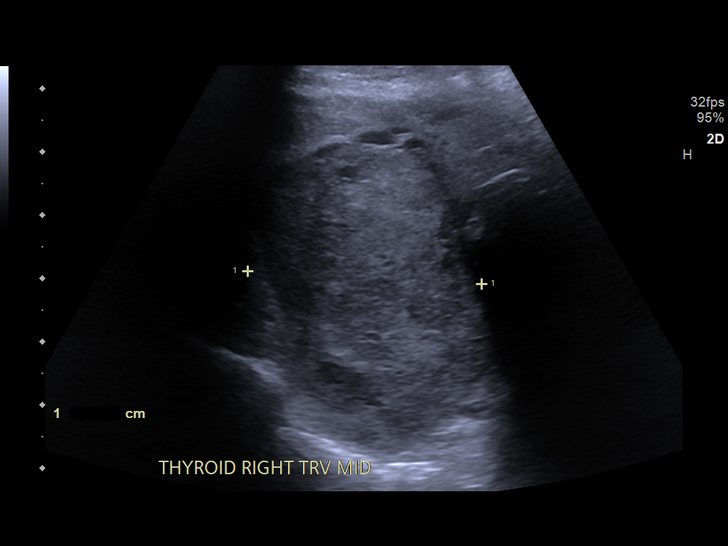
[im 14/53]
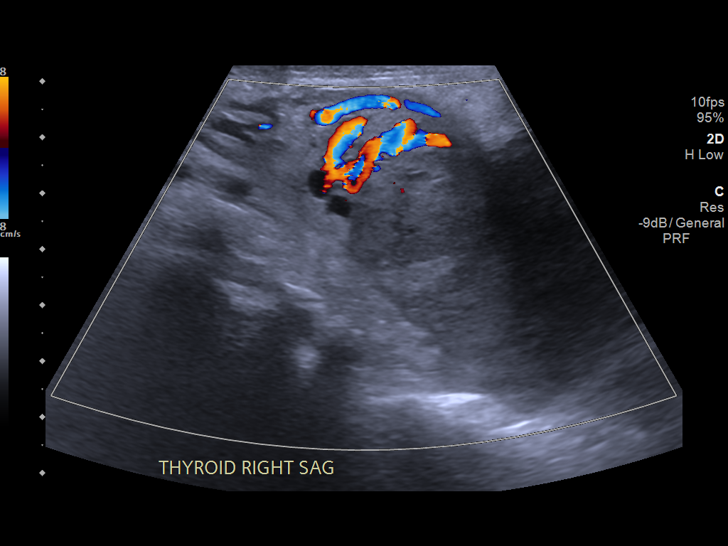
[im 18/53]
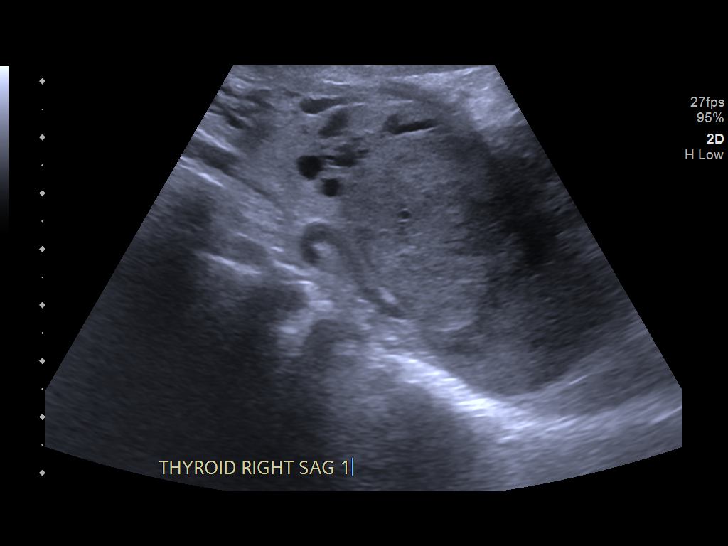
[im 22/53]
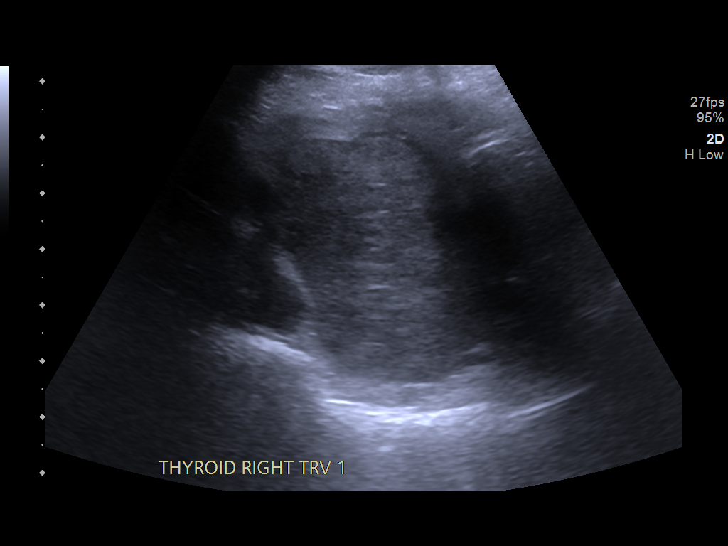
[im 27/53]
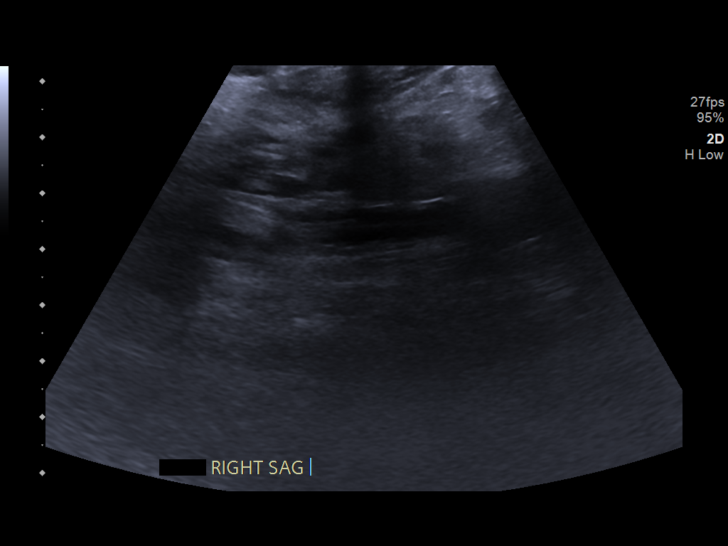
[im 31/53]
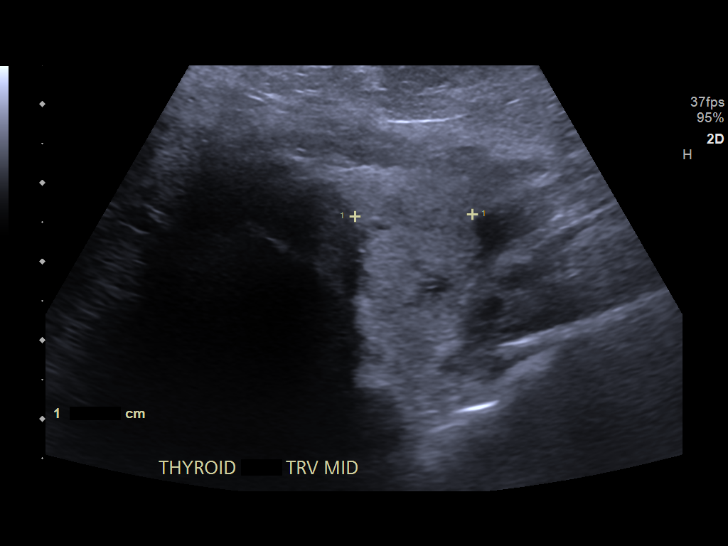
[im 35/53]
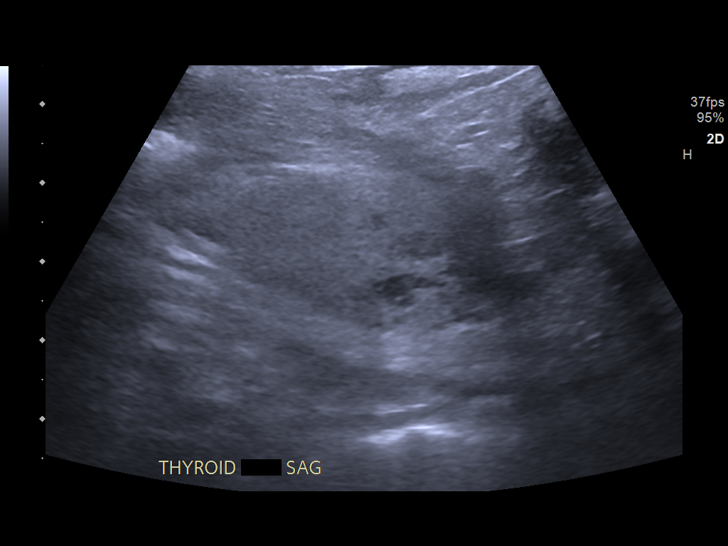
[im 40/53]
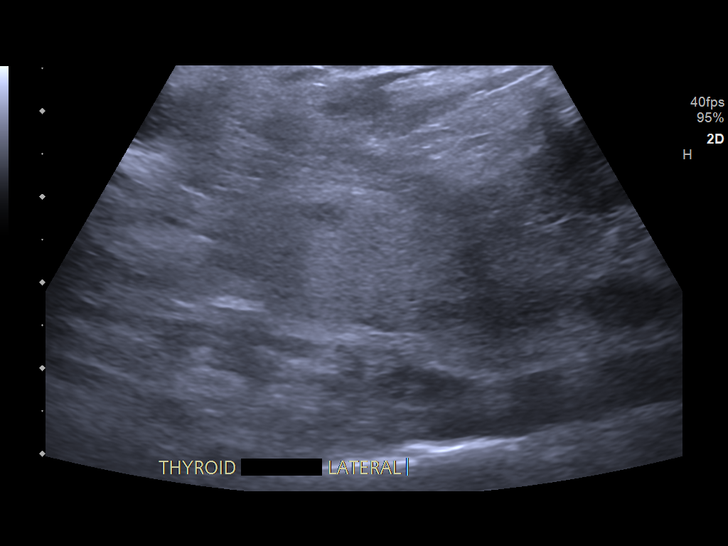
[im 44/53]
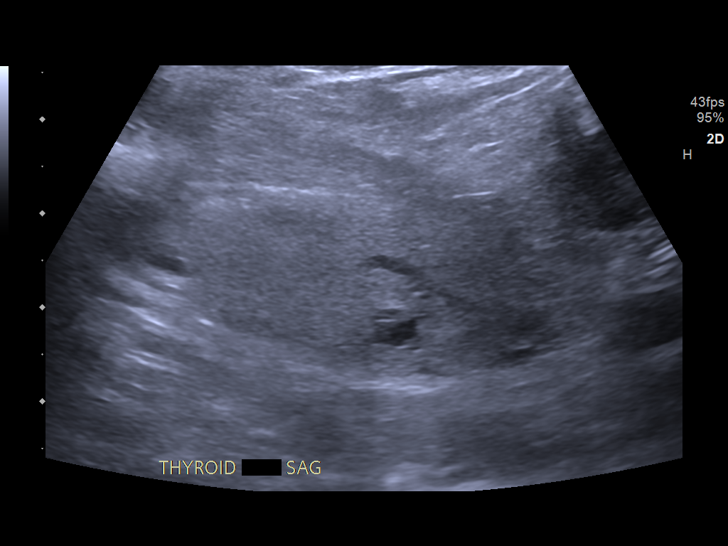
[im 48/53]
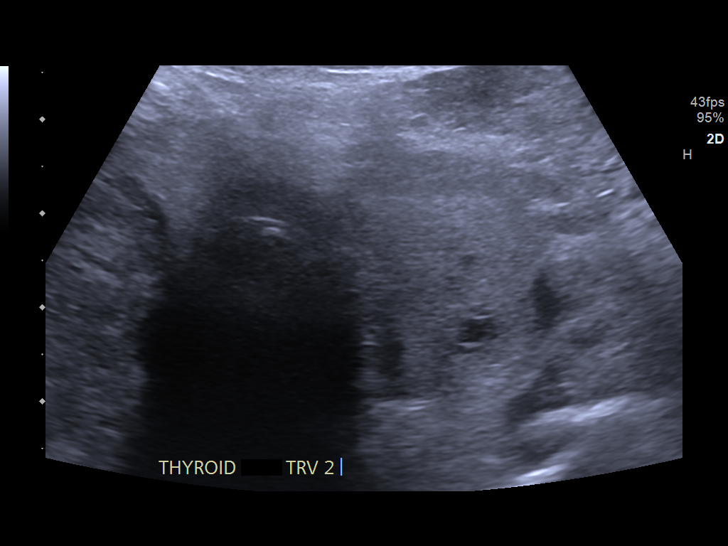
[im 53/53]
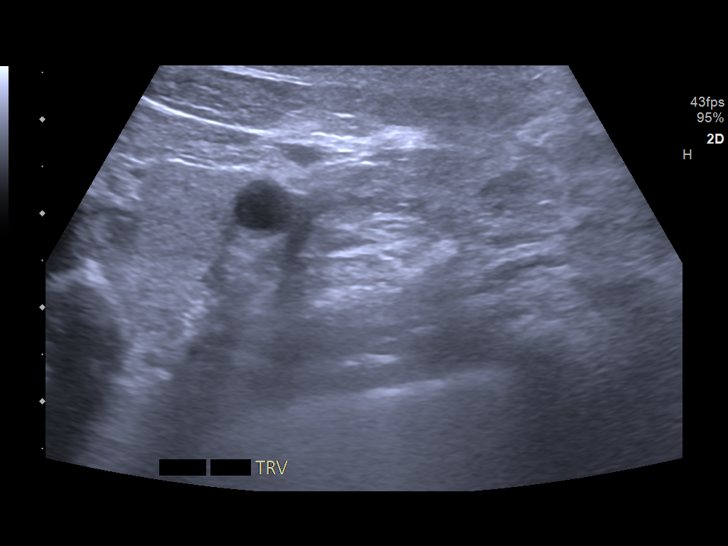

[13 of 25 positions shown; findings below may reference images not displayed]

FINDINGS: Parenchymal Echotexture: Mildly heterogenous

Isthmus: 0.6 cm

Right lobe: 7.7 x 4.2 x 3.7 cm

Left lobe: 4.8 x 1.9 x 1.5 cm

_________________________________________________________

Estimated total number of nodules >/= 1 cm: 1

Number of spongiform nodules >/=  2 cm not described below (TR1): 0

Number of mixed cystic and solid nodules >/= 1.5 cm not described
below (TR2): 0

_________________________________________________________

Nodule # 1:

Location: Right; Inferior

Maximum size: 5.5 cm; Other 2 dimensions: 4.4 x 3.8 cm

Composition: solid/almost completely solid (2)

Echogenicity: isoechoic (1)

Shape: taller-than-wide (3)

Margins: ill-defined (0)

Echogenic foci: none (0)

ACR TI-RADS total points: 6.

ACR TI-RADS risk category: TR4 (4-6 points).

ACR TI-RADS recommendations:

**Given size (>/= 1.5 cm) and appearance, fine needle aspiration of
this moderately suspicious nodule should be considered based on
TI-RADS criteria.

_________________________________________________________

Nodule # 2:

Location: Left; Inferior

Maximum size: 0.6 cm; Other 2 dimensions: 0.5 x 0.5 cm

Composition: cystic/almost completely cystic (0)

Echogenicity: anechoic (0)

Shape: not taller-than-wide (0)

Margins: smooth (0)

Echogenic foci: none (0)

ACR TI-RADS total points: 0.

ACR TI-RADS risk category: TR1 (0-1 points).

ACR TI-RADS recommendations:

This nodule does NOT meet TI-RADS criteria for biopsy or dedicated
follow-up.

_________________________________________________________

No abnormal lymph nodes identified.
IMPRESSION: 5.5 cm right inferior thyroid nodule meets criteria for elective
fine-needle aspiration. Margins of this nodule are difficult to
delineate due to thyroid goiter extending into the upper mediastinum
and therefore measurements may not be entirely accurate. However,
maximum diameter is clearly on the order of 5 cm.

The above is in keeping with the ACR TI-RADS recommendations - [HOSPITAL] [id number];[DATE].
